# Patient Record
Sex: Female | Born: 1980 | Race: White | Hispanic: No | Marital: Married | Smoking: Never smoker
Health system: Southern US, Community
[De-identification: ages and names within clinical notes are randomized; demographics above are authoritative.]

## PROBLEM LIST (undated history)

## (undated) ENCOUNTER — Inpatient Hospital Stay (HOSPITAL_COMMUNITY): Payer: Self-pay

## (undated) DIAGNOSIS — I10 Essential (primary) hypertension: Secondary | ICD-10-CM

## (undated) DIAGNOSIS — T7840XA Allergy, unspecified, initial encounter: Secondary | ICD-10-CM

## (undated) DIAGNOSIS — J342 Deviated nasal septum: Secondary | ICD-10-CM

## (undated) DIAGNOSIS — F419 Anxiety disorder, unspecified: Secondary | ICD-10-CM

## (undated) DIAGNOSIS — N6489 Other specified disorders of breast: Secondary | ICD-10-CM

## (undated) DIAGNOSIS — K219 Gastro-esophageal reflux disease without esophagitis: Secondary | ICD-10-CM

## (undated) HISTORY — PX: HERNIA REPAIR: SHX51

## (undated) HISTORY — PX: LIPOSUCTION MULTIPLE BODY PARTS: SUR832

## (undated) HISTORY — PX: OTHER SURGICAL HISTORY: SHX169

## (undated) HISTORY — DX: Allergy, unspecified, initial encounter: T78.40XA

---
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
## 2006-08-22 ENCOUNTER — Ambulatory Visit (HOSPITAL_COMMUNITY): Admission: RE | Admit: 2006-08-22 | Discharge: 2006-08-22 | Payer: Self-pay | Admitting: *Deleted

## 2011-05-19 ENCOUNTER — Other Ambulatory Visit: Payer: Self-pay | Admitting: Family Medicine

## 2011-05-19 ENCOUNTER — Ambulatory Visit (INDEPENDENT_AMBULATORY_CARE_PROVIDER_SITE_OTHER): Payer: 59 | Admitting: Family Medicine

## 2011-05-19 ENCOUNTER — Encounter: Payer: Self-pay | Admitting: Family Medicine

## 2011-05-19 DIAGNOSIS — M999 Biomechanical lesion, unspecified: Secondary | ICD-10-CM

## 2011-05-19 DIAGNOSIS — M9981 Other biomechanical lesions of cervical region: Secondary | ICD-10-CM

## 2011-05-19 MED ORDER — ALPRAZOLAM 0.5 MG PO TABS: 0.5000 mg | ORAL_TABLET | Freq: Three times a day (TID) | ORAL | Status: DC | PRN

## 2011-05-19 MED ORDER — ZOLPIDEM TARTRATE 10 MG PO TABS: 10.0000 mg | ORAL_TABLET | Freq: Every evening | ORAL | Status: DC | PRN

## 2011-05-19 MED ORDER — DROSPIRENONE-ETHINYL ESTRADIOL 3-0.02 MG PO TABS: 1.0000 | ORAL_TABLET | Freq: Every day | ORAL | Status: DC

## 2011-05-19 NOTE — Progress Notes (Signed)
  Subjective:    Patient ID: Caitlin Christensen, female    DOB: [birth year], 30 y.o.   MRN: [id number]  HPI  30 yo female here to establish care.  Pt does not have any health problems in the past except for some moles here and there.   Pt  complains of low back pain, worse after working three shifts in a row.  States it usually occurs after a long day, bilateral pain non radiating, improves mildly with aleve.  Pt denies any injury no bowel or bladder problems, no dysuria. Has been manipulated by me in the past with good results.   Some insomnia but takes [place] here and there with good results. Xanax for mild anxiety.   Pt gets her pap smears done at her gynecologists, is on birth control.  Family hx of heart dx but has recently had lab work done. States everything is normal.   Review of Systems     Objective:   Physical Exam BP 110/76  Pulse 101  Temp(Src) 98 F (36.7 C) (Oral)  Ht 5' 6.5" (1.689 m)  Wt 150 lb (68.04 kg)  BMI 23.85 kg/m2  LMP 04/21/2011 General appearance: alert, cooperative and no distress Eyes: conjunctivae/corneas clear. PERRL, EOM's intact. Fundi benign. Neck: no adenopathy, no carotid bruit, supple, symmetrical, trachea midline and thyroid not enlarged, symmetric, no tenderness/mass/nodules Back: mild loss f lordosis of lumbar region Heart: regular rate and rhythm, S1, S2 normal, no murmur, click, rub or gallop and normal apical impulse Abdomen: soft, non-tender; bowel sounds normal; no masses,  no organomegaly Extremities: extremities normal, atraumatic, no cyanosis or edema Pulses: 2+ and symmetric Skin: Skin color, texture, turgor normal. No rashes or lesions mils healing lesion on left forearm the seems to be resolving from mole removal.   OMT Findings: Cervical: C2 rotated and side bent left, c4 rotated and side bent right Thoracic: T5 rotated and side bent right, t7 rotated and side bent left Lumbar: L2 rotated and side bent right, L3 rotated and side  bent right Sacrum: right on right      Assessment & Plan:

## 2011-05-19 NOTE — Assessment & Plan Note (Signed)
After verbal consent pt did have HVLA, with marked improvement.  Gave side effects to look out for and can take anti inflammatories in the acute time frame.  Pt given stretches for tight psoas, told to focus on core strength.  Will see again in 3-6 weeks.

## 2011-05-19 NOTE — Patient Instructions (Signed)
It is so good to see you For your back try to do the stretches I showed you at least 2 times a day and then focus a little on core strength to help the lower back For your husband ask for a new patient form up front.  I would love to see him You should make an appointment with me in 3-6 weeks to see how you are doing with the back.  When you need a refill have your pharmacy call me.  Have a great memorial day weekend!

## 2011-05-19 NOTE — Assessment & Plan Note (Signed)
After verbal consent pt did have HVLA, with marked improvement.  Gave side effects to look out for and can take anti inflammatories in the acute time frame.   

## 2011-06-15 ENCOUNTER — Other Ambulatory Visit: Payer: Self-pay | Admitting: Family Medicine

## 2011-06-15 NOTE — Telephone Encounter (Signed)
Needs refill on Xanax - last refilled from previous doctor [facility]

## 2011-06-16 MED ORDER — ALPRAZOLAM 0.5 MG PO TABS: 0.5000 mg | ORAL_TABLET | Freq: Three times a day (TID) | ORAL | Status: DC | PRN

## 2011-06-16 NOTE — Telephone Encounter (Signed)
Printed and called pt to tell her that it will be up front for her to pick up.

## 2011-09-26 ENCOUNTER — Ambulatory Visit (INDEPENDENT_AMBULATORY_CARE_PROVIDER_SITE_OTHER): Payer: 59 | Admitting: Family Medicine

## 2011-09-26 ENCOUNTER — Encounter: Payer: Self-pay | Admitting: Family Medicine

## 2011-09-26 VITALS — BP 117/81 | HR 123 | Temp 98.5°F | Wt 148.0 lb

## 2011-09-26 DIAGNOSIS — J019 Acute sinusitis, unspecified: Secondary | ICD-10-CM

## 2011-09-26 DIAGNOSIS — J029 Acute pharyngitis, unspecified: Secondary | ICD-10-CM

## 2011-09-26 LAB — POCT RAPID STREP A (OFFICE): Rapid Strep A Screen: NEGATIVE

## 2011-09-26 MED ORDER — AMOXICILLIN 500 MG PO CAPS: 1000.0000 mg | ORAL_CAPSULE | Freq: Two times a day (BID) | ORAL | Status: AC

## 2011-09-26 NOTE — Patient Instructions (Signed)
I am sorry you are not feeling well.  Your rapid strep test was negative.  I think you have a sinus infection, I have sent a prescription for amoxicillin to your pharmacy.  You can also take over the counter tylenol and ibuprofen for your body aches.  Please come back and see [place] if you are not feeling better in a week.

## 2011-09-26 NOTE — Progress Notes (Signed)
Subjective:     Caitlin Christensen is a 30 y.o. female who presents for evaluation of sore throat, sinus pain, and popping in her ears. Associated symptoms include post nasal drip, sinus and nasal congestion and sore throat.  She also complains of generalized body aches.  Onset of symptoms was 1 week ago, and have been gradually worsening since that time. She is drinking plenty of fluids. She has not had a recent close exposure to someone with proven streptococcal pharyngitis or other illness.  She did receive her flu shot one week ago.    Review of Systems Pertinent items are noted in HPI.    Objective:    BP 117/81  Pulse 123  Temp(Src) 98.5 F (36.9 C) (Oral)  Wt 148 lb (67.132 kg)  LMP 09/12/2011 General appearance: alert, cooperative and no distress Eyes: PERRL, EOMIT. Conjunctiva normal.  Ears: abnormal TM right ear - dull and air-fluid level and abnormal TM left ear - dull and air-fluid level Nose: mild congestion, sinus tenderness bilateral Throat: normal findings: lips normal without lesions, buccal mucosa normal, gums healthy, teeth intact, non-carious, tongue midline and normal and soft palate, uvula, and tonsils normal Lungs: clear to auscultation bilaterally Heart: regular rate and rhythm, S1, S2 normal, no murmur, click, rub or gallop Abdomen: soft, non-tender; bowel sounds normal; no masses,  no organomegaly Extremities: extremities normal, atraumatic, no cyanosis or edema Pulses: 2+ and symmetric  Laboratory Strep test done. Results:negative.    Assessment:    Acute pharyngitis, likely  Sinusitis with post nasal drip.    Plan:  Rx. For Amoxicillin to treat Sinusitis.   Use of OTC analgesics recommended as well as salt water gargles. Use of decongestant recommended. Follow up as needed.

## 2011-10-16 ENCOUNTER — Telehealth: Payer: Self-pay | Admitting: Family Medicine

## 2011-10-16 MED ORDER — CETIRIZINE HCL 10 MG PO TABS: 10.0000 mg | ORAL_TABLET | Freq: Every day | ORAL | Status: DC

## 2011-10-16 MED ORDER — FLUTICASONE PROPIONATE 50 MCG/ACT NA SUSP: 2.0000 | Freq: Every day | NASAL | Status: DC

## 2011-10-16 NOTE — Telephone Encounter (Signed)
Called pt told her I would treat like allergies at this time would try flonase and zyrtec if not better in 1 weeks after starting then would consider antibiotics.

## 2011-10-16 NOTE — Telephone Encounter (Signed)
Was seen on 10/2 for a Sinus Infection, has finished the antibiotics that were prescribed, the symptoms are still persisting and she was hoping Dr. Katrinka Blazing would prescribe something without her being seen.  I did attempt to get her to make an appointment.  She would like to talk to Dr. Katrinka Blazing.

## 2012-01-19 ENCOUNTER — Telehealth: Payer: Self-pay | Admitting: Family Medicine

## 2012-01-19 MED ORDER — ZOLPIDEM TARTRATE 10 MG PO TABS: 10.0000 mg | ORAL_TABLET | Freq: Every evening | ORAL | Status: DC | PRN

## 2012-01-19 MED ORDER — ALPRAZOLAM 0.5 MG PO TABS: 0.5000 mg | ORAL_TABLET | Freq: Three times a day (TID) | ORAL | Status: DC | PRN

## 2012-01-19 NOTE — Telephone Encounter (Signed)
Needs refill on her Xanax .5  and Ambien 10mg  - states she had not gotten Ambien from [place] yet.  It was from a previous doctor [facility]

## 2012-01-19 NOTE — Telephone Encounter (Signed)
Will need to pick up prescriptions, they are printed and up front.

## 2012-01-22 NOTE — Telephone Encounter (Signed)
Left message on voicemail informing patient that rx's were up front ready to be picked up.

## 2012-04-16 ENCOUNTER — Ambulatory Visit: Payer: PRIVATE HEALTH INSURANCE | Attending: Family Medicine | Admitting: Physical Therapy

## 2012-04-16 DIAGNOSIS — M545 Low back pain, unspecified: Secondary | ICD-10-CM | POA: Insufficient documentation

## 2012-04-16 DIAGNOSIS — M546 Pain in thoracic spine: Secondary | ICD-10-CM | POA: Insufficient documentation

## 2012-04-16 DIAGNOSIS — IMO0001 Reserved for inherently not codable concepts without codable children: Secondary | ICD-10-CM | POA: Insufficient documentation

## 2012-04-18 ENCOUNTER — Ambulatory Visit: Payer: PRIVATE HEALTH INSURANCE | Attending: Family Medicine | Admitting: Physical Therapy

## 2012-04-18 DIAGNOSIS — IMO0001 Reserved for inherently not codable concepts without codable children: Secondary | ICD-10-CM | POA: Insufficient documentation

## 2012-04-18 DIAGNOSIS — M546 Pain in thoracic spine: Secondary | ICD-10-CM | POA: Insufficient documentation

## 2012-04-18 DIAGNOSIS — M545 Low back pain, unspecified: Secondary | ICD-10-CM | POA: Insufficient documentation

## 2012-04-25 ENCOUNTER — Encounter: Payer: Self-pay | Admitting: Physical Therapy

## 2012-04-25 ENCOUNTER — Ambulatory Visit
Payer: PRIVATE HEALTH INSURANCE | Attending: Family Medicine | Admitting: Rehabilitative and Restorative Service Providers"

## 2012-04-25 DIAGNOSIS — M545 Low back pain, unspecified: Secondary | ICD-10-CM | POA: Insufficient documentation

## 2012-04-25 DIAGNOSIS — M546 Pain in thoracic spine: Secondary | ICD-10-CM | POA: Insufficient documentation

## 2012-04-25 DIAGNOSIS — IMO0001 Reserved for inherently not codable concepts without codable children: Secondary | ICD-10-CM | POA: Insufficient documentation

## 2012-04-30 ENCOUNTER — Ambulatory Visit: Payer: PRIVATE HEALTH INSURANCE | Admitting: Physical Therapy

## 2012-05-02 ENCOUNTER — Ambulatory Visit: Payer: PRIVATE HEALTH INSURANCE | Admitting: Physical Therapy

## 2012-05-07 ENCOUNTER — Ambulatory Visit: Payer: PRIVATE HEALTH INSURANCE | Admitting: Rehabilitative and Restorative Service Providers"

## 2012-05-09 ENCOUNTER — Ambulatory Visit: Payer: PRIVATE HEALTH INSURANCE | Admitting: Physical Therapy

## 2012-06-19 ENCOUNTER — Encounter: Payer: Self-pay | Admitting: Family Medicine

## 2012-06-19 ENCOUNTER — Ambulatory Visit (INDEPENDENT_AMBULATORY_CARE_PROVIDER_SITE_OTHER): Payer: 59 | Admitting: Family Medicine

## 2012-06-19 VITALS — BP 121/85 | HR 102 | Temp 98.5°F | Ht 66.5 in | Wt 155.0 lb

## 2012-06-19 DIAGNOSIS — F419 Anxiety disorder, unspecified: Secondary | ICD-10-CM

## 2012-06-19 DIAGNOSIS — F411 Generalized anxiety disorder: Secondary | ICD-10-CM

## 2012-06-19 DIAGNOSIS — M549 Dorsalgia, unspecified: Secondary | ICD-10-CM

## 2012-06-19 DIAGNOSIS — M999 Biomechanical lesion, unspecified: Secondary | ICD-10-CM

## 2012-06-19 MED ORDER — HYDROXYZINE HCL 25 MG PO TABS: 25.0000 mg | ORAL_TABLET | Freq: Three times a day (TID) | ORAL | Status: DC | PRN

## 2012-06-19 MED ORDER — ALPRAZOLAM 0.5 MG PO TABS: 0.5000 mg | ORAL_TABLET | Freq: Three times a day (TID) | ORAL | Status: DC | PRN

## 2012-06-19 NOTE — Patient Instructions (Signed)
It is wonderful to see you. Have giving you a prescription for Xanax. Try to only uses one time a day as needed hopefully you'll be using it much less than that. Also giving you a prescription for hydroxyzine that you can use up to 3 times a day. First time he take it please be at home because it might make you sleepy. Continue doing her exercises and stretches I think they're making the biggest difference. If you want you to make an appointment with me at sports medicine in 4-6 weeks for manipulation.

## 2012-06-19 NOTE — Progress Notes (Signed)
Patient ID: Caitlin Christensen, female   DOB: 26-Feb-1981, 31 y.o.   MRN: [id number]  31 year old female with no significant past medical history coming in for  #1 back pain-patient did have a work related injury which she has seen occupational health for over the course of the last month. Patient has been doing physical therapy and has been improving somewhat but still has some lower back pain. Patient states it's mostly right-sided non-radiating, no bowel or bladder incontinence, no weakness in the legs. Patient states that it does seem to be though constant with intermittent flares. Patient has been taking over-the-counter medications with minimal improvement. Patient has had manipulation done on her before and is wondering if that could be beneficial.  #2 anxiety-patient has had a lot of anxiety throughout her whole life she states it has seemed to be worsening since her injury. Patient feels like she can become overwhelmed at work as well as at home. Patient states that her husband is her saving grace but she does have trouble with her parents who live very close. Patient states at work with many of the different changes that are happening at this time morale is low and there is a lot more attitudes that seemed to be conflicting which has not made a nice working environment. Patient states she has the same she would call panic attacks where she feels her heart rate due to racing she starts sweating and she has to get away from what ever environment that this is occurring in. Patient has had Xanax in the past which does help but she only uses it one or 2 pills a month it seems to. Patient denies any depressive thoughts denies any suicidal or homicidal ideation, and denies any insomnia.  Review of systems: Denies fever, chills, nausea vomiting abdominal pain, dysuria, chest pain, shortness of breath dyspnea on exertion or numbness in extremities  Physical exam In general: No apparent distress healthy  female Cardiovascular: Regular rate and rhythm no murmur Pulmonary: Clear to auscultation bilaterally Abdomen: Bowel sounds positive nontender nondistended Extremities: Neurovascularly intact with 5 out of 5 strength.  OMT Physical Exam  Cervical  C 2 F RS left C4 F RS right  Thoracic T4 E RS left  Lumbar L3 F RS right  Sacrum Right on right  [place] right anterior

## 2012-06-20 DIAGNOSIS — F419 Anxiety disorder, unspecified: Secondary | ICD-10-CM | POA: Insufficient documentation

## 2012-06-20 DIAGNOSIS — M545 Low back pain, unspecified: Secondary | ICD-10-CM | POA: Insufficient documentation

## 2012-06-20 NOTE — Assessment & Plan Note (Signed)
Patient does have mostly situational anxiety with some potential panic attacks. At this time we'll give Xanax as needed the patient is more concerned to 2 having these panic attacks and environmental situations such as work. I think at this time potentially some hydroxyzine may be more pertinent to patient's problem. We will do 25 mg up to 3 times a day as needed. Patient told to try this medication at home initially to see if this makes her tired or fatigued at all. Patient and will still has headaches on hand when she definitely needs it and she does not need to work. I do not feel that there is any underlying depression or generalized anxiety disorder call for a more constant interval of medication such as an antidepressant. Patient will followup again in 4-6 weeks.

## 2012-06-20 NOTE — Assessment & Plan Note (Signed)
Encourage patient to do more core strengthening exercises, discussed weight loss, discussed proper stretching techniques as well as lifting techniques.

## 2012-06-20 NOTE — Assessment & Plan Note (Signed)
Patient after verbal consent patient did have HVLA and muscle energy techniques with marked improvement. Please see back pain assessment and plan for further instructions given to patient. Patient will followup in 4-6 weeks.

## 2012-08-27 ENCOUNTER — Ambulatory Visit (INDEPENDENT_AMBULATORY_CARE_PROVIDER_SITE_OTHER): Payer: 59 | Admitting: Family Medicine

## 2012-08-27 ENCOUNTER — Encounter: Payer: Self-pay | Admitting: Family Medicine

## 2012-08-27 VITALS — BP 112/74 | HR 79 | Ht 66.0 in | Wt 152.0 lb

## 2012-08-27 DIAGNOSIS — F411 Generalized anxiety disorder: Secondary | ICD-10-CM

## 2012-08-27 DIAGNOSIS — F419 Anxiety disorder, unspecified: Secondary | ICD-10-CM

## 2012-08-27 DIAGNOSIS — M999 Biomechanical lesion, unspecified: Secondary | ICD-10-CM

## 2012-08-27 MED ORDER — MELOXICAM 15 MG PO TABS: 15.0000 mg | ORAL_TABLET | Freq: Every day | ORAL | Status: DC

## 2012-08-27 MED ORDER — HYDROXYZINE HCL 25 MG PO TABS: 25.0000 mg | ORAL_TABLET | Freq: Three times a day (TID) | ORAL | Status: DC | PRN

## 2012-08-27 MED ORDER — TRAMADOL HCL 50 MG PO TABS: 50.0000 mg | ORAL_TABLET | Freq: Every evening | ORAL | Status: AC | PRN

## 2012-08-27 NOTE — Progress Notes (Signed)
Patient ID: Caitlin Christensen, female   DOB: 1981-09-27, 31 y.o.   MRN: [id number]  31 year old female with no significant past medical history coming in for  #1 back pain- patient has finished physical therapy and occupational health at this time. Patient states that her back pain has improved significantly. Patient still has some mild low back pain, nonradiating mostly related to work after a long day. Patient denies any radiation of the pain, any weakness in the legs or any bowel or bladder incontinence. Patient states that overall she seems to be improving with the exercises that she has been doing. Patient is also started going to the gym on a regular basis which has been beneficial.  #2 anxiety-patient states that she is doing much better. Patient has been meditating which is help. In addition this patient has also been taking the hydroxyzine and I prescribed as needed. Patient states she uses 2 or 3 of them in a week. Patient denies any side effects to the medication and states that it has helped out significantly. Patient would like a refill today.  Review of systems: Denies fever, chills, nausea vomiting abdominal pain, dysuria, chest pain, shortness of breath dyspnea on exertion or numbness in extremities  Physical exam In general: No apparent distress healthy female Cardiovascular: Regular rate and rhythm no murmur Pulmonary: Clear to auscultation bilaterally Abdomen: Bowel sounds positive nontender nondistended Extremities: Neurovascularly intact with 5 out of 5 strength.  OMT Physical Exam  Cervical  C4 F RS right  Thoracic T4 E RS left T6 E RS right  Lumbar L3 F RS right L5 F RS right  Sacrum Right on right  [place] right anterior

## 2012-08-27 NOTE — Assessment & Plan Note (Signed)
Decision today to treat with OMT was based on Physical Exam  After verbal consent patient was treated with HVLA techniques in lumbar and sacral areas  Patient tolerated the procedure well with improvement in symptoms  Patient given exercises, stretches and lifestyle modifications  See medications in patient instructions if given  Patient will follow up in 6 weeks

## 2012-08-27 NOTE — Assessment & Plan Note (Signed)
The patient is doing very well on a regimen. Patient has no signs of depression. Patient is not having any insomnia or any trouble at work with this medication. We'll refill this medication and followup as needed. Patient knows if she has any findings of depression she will give me a call and we will discuss this further.

## 2012-08-27 NOTE — Patient Instructions (Addendum)
I sent in your meds including tramadol and meloxicam to try  I will see you when I see you. Tell everyone hi.

## 2013-01-01 ENCOUNTER — Ambulatory Visit (INDEPENDENT_AMBULATORY_CARE_PROVIDER_SITE_OTHER): Payer: 59 | Admitting: Internal Medicine

## 2013-01-01 ENCOUNTER — Other Ambulatory Visit (INDEPENDENT_AMBULATORY_CARE_PROVIDER_SITE_OTHER): Payer: 59

## 2013-01-01 ENCOUNTER — Encounter: Payer: Self-pay | Admitting: Internal Medicine

## 2013-01-01 VITALS — BP 112/68 | HR 98 | Temp 97.6°F | Resp 16 | Ht 66.5 in | Wt 150.0 lb

## 2013-01-01 DIAGNOSIS — Z0000 Encounter for general adult medical examination without abnormal findings: Secondary | ICD-10-CM

## 2013-01-01 LAB — LIPID PANEL
Cholesterol: 164 mg/dL (ref 0–200)
LDL Cholesterol: 114 mg/dL — ABNORMAL HIGH (ref 0–99)
Total CHOL/HDL Ratio: 5

## 2013-01-01 LAB — COMPREHENSIVE METABOLIC PANEL
ALT: 21 U/L (ref 0–35)
AST: 17 U/L (ref 0–37)
Albumin: 3.8 g/dL (ref 3.5–5.2)
BUN: 8 mg/dL (ref 6–23)
CO2: 25 mEq/L (ref 19–32)
Calcium: 8.9 mg/dL (ref 8.4–10.5)
Chloride: 110 mEq/L (ref 96–112)
GFR: 103.15 mL/min (ref >60.00)
Potassium: 4.2 mEq/L (ref 3.5–5.1)

## 2013-01-01 LAB — CBC WITH DIFFERENTIAL/PLATELET
Basophils Absolute: 0 10*3/uL (ref 0.0–0.1)
Eosinophils Absolute: 0.1 10*3/uL (ref 0.0–0.7)
Lymphocytes Relative: 43.4 % (ref 12.0–46.0)
MCHC: 33.9 g/dL (ref 30.0–36.0)
Neutrophils Relative %: 44.7 % (ref 43.0–77.0)
Platelets: 493 10*3/uL — ABNORMAL HIGH (ref 150.0–400.0)
RDW: 12.7 % (ref 11.5–14.6)

## 2013-01-01 NOTE — Assessment & Plan Note (Signed)
Exam done Vaccines were reviewed Labs ordered Pt ed material was given 

## 2013-01-01 NOTE — Patient Instructions (Signed)
Preventive Care for Adults, Female A healthy lifestyle and preventive care can promote health and wellness. Preventive health guidelines for women include the following key practices.  A routine yearly physical is a good way to check with your caregiver about your health and preventive screening. It is a chance to share any concerns and updates on your health, and to receive a thorough exam.  Visit your dentist for a routine exam and preventive care every 6 months. Brush your teeth twice a day and floss once a day. Good oral hygiene prevents tooth decay and gum disease.  The frequency of eye exams is based on your age, health, family medical history, use of contact lenses, and other factors. Follow your caregiver's recommendations for frequency of eye exams.  Eat a healthy diet. Foods like vegetables, fruits, whole grains, low-fat dairy products, and lean protein foods contain the nutrients you need without too many calories. Decrease your intake of foods high in solid fats, added sugars, and salt. Eat the right amount of calories for you.Get information about a proper diet from your caregiver, if necessary.  Regular physical exercise is one of the most important things you can do for your health. Most adults should get at least 150 minutes of moderate-intensity exercise (any activity that increases your heart rate and causes you to sweat) each week. In addition, most adults need muscle-strengthening exercises on 2 or more days a week.  Maintain a healthy weight. The body mass index (BMI) is a screening tool to identify possible weight problems. It provides an estimate of body fat based on height and weight. Your caregiver can help determine your BMI, and can help you achieve or maintain a healthy weight.For adults 20 years and older:  A BMI below 18.5 is considered underweight.  A BMI of 18.5 to 24.9 is normal.  A BMI of 25 to 29.9 is considered overweight.  A BMI of 30 and above is  considered obese.  Maintain normal blood lipids and cholesterol levels by exercising and minimizing your intake of saturated fat. Eat a balanced diet with plenty of fruit and vegetables. Blood tests for lipids and cholesterol should begin at age 20 and be repeated every 5 years. If your lipid or cholesterol levels are high, you are over 50, or you are at high risk for heart disease, you may need your cholesterol levels checked more frequently.Ongoing high lipid and cholesterol levels should be treated with medicines if diet and exercise are not effective.  If you smoke, find out from your caregiver how to quit. If you do not use tobacco, do not start.  If you are pregnant, do not drink alcohol. If you are breastfeeding, be very cautious about drinking alcohol. If you are not pregnant and choose to drink alcohol, do not exceed 1 drink per day. One drink is considered to be 12 ounces (355 mL) of beer, 5 ounces (148 mL) of wine, or 1.5 ounces (44 mL) of liquor.  Avoid use of street drugs. Do not share needles with anyone. Ask for help if you need support or instructions about stopping the use of drugs.  High blood pressure causes heart disease and increases the risk of stroke. Your blood pressure should be checked at least every 1 to 2 years. Ongoing high blood pressure should be treated with medicines if weight loss and exercise are not effective.  If you are 55 to 32 years old, ask your caregiver if you should take aspirin to prevent strokes.  Diabetes   screening involves taking a blood sample to check your fasting blood sugar level. This should be done once every 3 years, after age 45, if you are within normal weight and without risk factors for diabetes. Testing should be considered at a younger age or be carried out more frequently if you are overweight and have at least 1 risk factor for diabetes.  Breast cancer screening is essential preventive care for women. You should practice "breast  self-awareness." This means understanding the normal appearance and feel of your breasts and may include breast self-examination. Any changes detected, no matter how small, should be reported to a caregiver. Women in their 20s and 30s should have a clinical breast exam (CBE) by a caregiver as part of a regular health exam every 1 to 3 years. After age 40, women should have a CBE every year. Starting at age 40, women should consider having a mammography (breast X-ray test) every year. Women who have a family history of breast cancer should talk to their caregiver about genetic screening. Women at a high risk of breast cancer should talk to their caregivers about having magnetic resonance imaging (MRI) and a mammography every year.  The Pap test is a screening test for cervical cancer. A Pap test can show cell changes on the cervix that might become cervical cancer if left untreated. A Pap test is a procedure in which cells are obtained and examined from the lower end of the uterus (cervix).  Women should have a Pap test starting at age 21.  Between ages 21 and 29, Pap tests should be repeated every 2 years.  Beginning at age 30, you should have a Pap test every 3 years as long as the past 3 Pap tests have been normal.  Some women have medical problems that increase the chance of getting cervical cancer. Talk to your caregiver about these problems. It is especially important to talk to your caregiver if a new problem develops soon after your last Pap test. In these cases, your caregiver may recommend more frequent screening and Pap tests.  The above recommendations are the same for women who have or have not gotten the vaccine for human papillomavirus (HPV).  If you had a hysterectomy for a problem that was not cancer or a condition that could lead to cancer, then you no longer need Pap tests. Even if you no longer need a Pap test, a regular exam is a good idea to make sure no other problems are  starting.  If you are between ages 65 and 70, and you have had normal Pap tests going back 10 years, you no longer need Pap tests. Even if you no longer need a Pap test, a regular exam is a good idea to make sure no other problems are starting.  If you have had past treatment for cervical cancer or a condition that could lead to cancer, you need Pap tests and screening for cancer for at least 20 years after your treatment.  If Pap tests have been discontinued, risk factors (such as a new sexual partner) need to be reassessed to determine if screening should be resumed.  The HPV test is an additional test that may be used for cervical cancer screening. The HPV test looks for the virus that can cause the cell changes on the cervix. The cells collected during the Pap test can be tested for HPV. The HPV test could be used to screen women aged 30 years and older, and should   be used in women of any age who have unclear Pap test results. After the age of 30, women should have HPV testing at the same frequency as a Pap test.  Colorectal cancer can be detected and often prevented. Most routine colorectal cancer screening begins at the age of 50 and continues through age 75. However, your caregiver may recommend screening at an earlier age if you have risk factors for colon cancer. On a yearly basis, your caregiver may provide home test kits to check for hidden blood in the stool. Use of a small camera at the end of a tube, to directly examine the colon (sigmoidoscopy or colonoscopy), can detect the earliest forms of colorectal cancer. Talk to your caregiver about this at age 50, when routine screening begins. Direct examination of the colon should be repeated every 5 to 10 years through age 75, unless early forms of pre-cancerous polyps or small growths are found.  Hepatitis C blood testing is recommended for all people born from 1945 through 1965 and any individual with known risks for hepatitis C.  Practice  safe sex. Use condoms and avoid high-risk sexual practices to reduce the spread of sexually transmitted infections (STIs). STIs include gonorrhea, chlamydia, syphilis, trichomonas, herpes, HPV, and human immunodeficiency virus (HIV). Herpes, HIV, and HPV are viral illnesses that have no cure. They can result in disability, cancer, and death. Sexually active women aged 25 and younger should be checked for chlamydia. Older women with new or multiple partners should also be tested for chlamydia. Testing for other STIs is recommended if you are sexually active and at increased risk.  Osteoporosis is a disease in which the bones lose minerals and strength with aging. This can result in serious bone fractures. The risk of osteoporosis can be identified using a bone density scan. Women ages 65 and over and women at risk for fractures or osteoporosis should discuss screening with their caregivers. Ask your caregiver whether you should take a calcium supplement or vitamin D to reduce the rate of osteoporosis.  Menopause can be associated with physical symptoms and risks. Hormone replacement therapy is available to decrease symptoms and risks. You should talk to your caregiver about whether hormone replacement therapy is right for you.  Use sunscreen with sun protection factor (SPF) of 30 or more. Apply sunscreen liberally and repeatedly throughout the day. You should seek shade when your shadow is shorter than you. Protect yourself by wearing long sleeves, pants, a wide-brimmed hat, and sunglasses year round, whenever you are outdoors.  Once a month, do a whole body skin exam, using a mirror to look at the skin on your back. Notify your caregiver of new moles, moles that have irregular borders, moles that are larger than a pencil eraser, or moles that have changed in shape or color.  Stay current with required immunizations.  Influenza. You need a dose every fall (or winter). The composition of the flu vaccine  changes each year, so being vaccinated once is not enough.  Pneumococcal polysaccharide. You need 1 to 2 doses if you smoke cigarettes or if you have certain chronic medical conditions. You need 1 dose at age 65 (or older) if you have never been vaccinated.  Tetanus, diphtheria, pertussis (Tdap, Td). Get 1 dose of Tdap vaccine if you are younger than age 65, are over 65 and have contact with an infant, are a healthcare worker, are pregnant, or simply want to be protected from whooping cough. After that, you need a Td   booster dose every 10 years. Consult your caregiver if you have not had at least 3 tetanus and diphtheria-containing shots sometime in your life or have a deep or dirty wound.  HPV. You need this vaccine if you are a woman age 26 or younger. The vaccine is given in 3 doses over 6 months.  Measles, mumps, rubella (MMR). You need at least 1 dose of MMR if you were born in 1957 or later. You may also need a second dose.  Meningococcal. If you are age 19 to 21 and a first-year college student living in a residence hall, or have one of several medical conditions, you need to get vaccinated against meningococcal disease. You may also need additional booster doses.  Zoster (shingles). If you are age 60 or older, you should get this vaccine.  Varicella (chickenpox). If you have never had chickenpox or you were vaccinated but received only 1 dose, talk to your caregiver to find out if you need this vaccine.  Hepatitis A. You need this vaccine if you have a specific risk factor for hepatitis A virus infection or you simply wish to be protected from this disease. The vaccine is usually given as 2 doses, 6 to 18 months apart.  Hepatitis B. You need this vaccine if you have a specific risk factor for hepatitis B virus infection or you simply wish to be protected from this disease. The vaccine is given in 3 doses, usually over 6 months. Preventive Services / Frequency Ages 19 to 39  Blood  pressure check.** / Every 1 to 2 years.  Lipid and cholesterol check.** / Every 5 years beginning at age 20.  Clinical breast exam.** / Every 3 years for women in their 20s and 30s.  Pap test.** / Every 2 years from ages 21 through 29. Every 3 years starting at age 30 through age 65 or 70 with a history of 3 consecutive normal Pap tests.  HPV screening.** / Every 3 years from ages 30 through ages 65 to 70 with a history of 3 consecutive normal Pap tests.  Hepatitis C blood test.** / For any individual with known risks for hepatitis C.  Skin self-exam. / Monthly.  Influenza immunization.** / Every year.  Pneumococcal polysaccharide immunization.** / 1 to 2 doses if you smoke cigarettes or if you have certain chronic medical conditions.  Tetanus, diphtheria, pertussis (Tdap, Td) immunization. / A one-time dose of Tdap vaccine. After that, you need a Td booster dose every 10 years.  HPV immunization. / 3 doses over 6 months, if you are 26 and younger.  Measles, mumps, rubella (MMR) immunization. / You need at least 1 dose of MMR if you were born in 1957 or later. You may also need a second dose.  Meningococcal immunization. / 1 dose if you are age 19 to 21 and a first-year college student living in a residence hall, or have one of several medical conditions, you need to get vaccinated against meningococcal disease. You may also need additional booster doses.  Varicella immunization.** / Consult your caregiver.  Hepatitis A immunization.** / Consult your caregiver. 2 doses, 6 to 18 months apart.  Hepatitis B immunization.** / Consult your caregiver. 3 doses usually over 6 months. Ages 40 to 64  Blood pressure check.** / Every 1 to 2 years.  Lipid and cholesterol check.** / Every 5 years beginning at age 20.  Clinical breast exam.** / Every year after age 40.  Mammogram.** / Every year beginning at age 40   and continuing for as long as you are in good health. Consult with your  caregiver.  Pap test.** / Every 3 years starting at age 30 through age 65 or 70 with a history of 3 consecutive normal Pap tests.  HPV screening.** / Every 3 years from ages 30 through ages 65 to 70 with a history of 3 consecutive normal Pap tests.  Fecal occult blood test (FOBT) of stool. / Every year beginning at age 50 and continuing until age 75. You may not need to do this test if you get a colonoscopy every 10 years.  Flexible sigmoidoscopy or colonoscopy.** / Every 5 years for a flexible sigmoidoscopy or every 10 years for a colonoscopy beginning at age 50 and continuing until age 75.  Hepatitis C blood test.** / For all people born from 1945 through 1965 and any individual with known risks for hepatitis C.  Skin self-exam. / Monthly.  Influenza immunization.** / Every year.  Pneumococcal polysaccharide immunization.** / 1 to 2 doses if you smoke cigarettes or if you have certain chronic medical conditions.  Tetanus, diphtheria, pertussis (Tdap, Td) immunization.** / A one-time dose of Tdap vaccine. After that, you need a Td booster dose every 10 years.  Measles, mumps, rubella (MMR) immunization. / You need at least 1 dose of MMR if you were born in 1957 or later. You may also need a second dose.  Varicella immunization.** / Consult your caregiver.  Meningococcal immunization.** / Consult your caregiver.  Hepatitis A immunization.** / Consult your caregiver. 2 doses, 6 to 18 months apart.  Hepatitis B immunization.** / Consult your caregiver. 3 doses, usually over 6 months. Ages 65 and over  Blood pressure check.** / Every 1 to 2 years.  Lipid and cholesterol check.** / Every 5 years beginning at age 20.  Clinical breast exam.** / Every year after age 40.  Mammogram.** / Every year beginning at age 40 and continuing for as long as you are in good health. Consult with your caregiver.  Pap test.** / Every 3 years starting at age 30 through age 65 or 70 with a 3  consecutive normal Pap tests. Testing can be stopped between 65 and 70 with 3 consecutive normal Pap tests and no abnormal Pap or HPV tests in the past 10 years.  HPV screening.** / Every 3 years from ages 30 through ages 65 or 70 with a history of 3 consecutive normal Pap tests. Testing can be stopped between 65 and 70 with 3 consecutive normal Pap tests and no abnormal Pap or HPV tests in the past 10 years.  Fecal occult blood test (FOBT) of stool. / Every year beginning at age 50 and continuing until age 75. You may not need to do this test if you get a colonoscopy every 10 years.  Flexible sigmoidoscopy or colonoscopy.** / Every 5 years for a flexible sigmoidoscopy or every 10 years for a colonoscopy beginning at age 50 and continuing until age 75.  Hepatitis C blood test.** / For all people born from 1945 through 1965 and any individual with known risks for hepatitis C.  Osteoporosis screening.** / A one-time screening for women ages 65 and over and women at risk for fractures or osteoporosis.  Skin self-exam. / Monthly.  Influenza immunization.** / Every year.  Pneumococcal polysaccharide immunization.** / 1 dose at age 65 (or older) if you have never been vaccinated.  Tetanus, diphtheria, pertussis (Tdap, Td) immunization. / A one-time dose of Tdap vaccine if you are over   65 and have contact with an infant, are a healthcare worker, or simply want to be protected from whooping cough. After that, you need a Td booster dose every 10 years.  Varicella immunization.** / Consult your caregiver.  Meningococcal immunization.** / Consult your caregiver.  Hepatitis A immunization.** / Consult your caregiver. 2 doses, 6 to 18 months apart.  Hepatitis B immunization.** / Check with your caregiver. 3 doses, usually over 6 months. ** Family history and personal history of risk and conditions may change your caregiver's recommendations. Document Released: 02/06/2002 Document Revised: 03/04/2012  Document Reviewed: 05/08/2011 [employer] Patient Information 2013 [employer].  

## 2013-01-01 NOTE — Progress Notes (Signed)
  Subjective:    Patient ID: Caitlin Christensen, female    DOB: 13-Sep-1981, 32 y.o.   MRN: [id number]  HPI  New to me she comes in for a physical - she has recovered from a recent URI and GI illness but tells me that she feels well today.  Review of Systems  Constitutional: Negative.   HENT: Negative.   Eyes: Negative.   Respiratory: Negative.   Cardiovascular: Negative.   Gastrointestinal: Negative.   Genitourinary: Negative.   Musculoskeletal: Negative.   Skin: Negative.   Neurological: Negative.   Hematological: Negative.   Psychiatric/Behavioral: Negative.        Objective:   Physical Exam  Vitals reviewed. Constitutional: She is oriented to person, place, and time. She appears well-developed and well-nourished. No distress.  HENT:  Head: Normocephalic and atraumatic.  Mouth/Throat: Oropharynx is clear and moist. No oropharyngeal exudate.  Eyes: Conjunctivae normal are normal. Right eye exhibits no discharge. Left eye exhibits no discharge. No scleral icterus.  Neck: Normal range of motion. Neck supple. No JVD present. No tracheal deviation present. No thyromegaly present.  Cardiovascular: Normal rate, regular rhythm, normal heart sounds and intact distal pulses.  Exam reveals no gallop and no friction rub.   No murmur heard. Pulmonary/Chest: Effort normal and breath sounds normal. No stridor. No respiratory distress. She has no wheezes. She has no rales. She exhibits no tenderness.  Abdominal: Soft. Bowel sounds are normal. She exhibits no distension and no mass. There is no tenderness. There is no rebound and no guarding.  Musculoskeletal: Normal range of motion. She exhibits no edema and no tenderness.  Lymphadenopathy:    She has no cervical adenopathy.  Neurological: She is oriented to person, place, and time.  Skin: Skin is warm and dry. No rash noted. She is not diaphoretic. No erythema. No pallor.  Psychiatric: She has a normal mood and affect. Her behavior is normal.  Judgment and thought content normal.     No results found for this basename: WBC, HGB, HCT, PLT, GLUCOSE, CHOL, TRIG, HDL, LDLDIRECT, LDLCALC, ALT, AST, NA, K, CL, CREATININE, BUN, CO2, TSH, PSA, INR, GLUF, HGBA1C, MICROALBUR       Assessment & Plan:

## 2013-10-30 ENCOUNTER — Other Ambulatory Visit: Payer: Self-pay

## 2014-11-17 LAB — OB RESULTS CONSOLE RPR: RPR: NONREACTIVE

## 2014-11-17 LAB — OB RESULTS CONSOLE ABO/RH: RH Type: POSITIVE

## 2014-11-17 LAB — OB RESULTS CONSOLE HEPATITIS B SURFACE ANTIGEN: Hepatitis B Surface Ag: NEGATIVE

## 2014-11-17 LAB — OB RESULTS CONSOLE HIV ANTIBODY (ROUTINE TESTING): HIV: NONREACTIVE

## 2014-11-17 LAB — OB RESULTS CONSOLE ANTIBODY SCREEN: Antibody Screen: NEGATIVE

## 2014-11-17 LAB — OB RESULTS CONSOLE RUBELLA ANTIBODY, IGM: Rubella: NON-IMMUNE/NOT IMMUNE

## 2014-12-25 ENCOUNTER — Inpatient Hospital Stay (HOSPITAL_COMMUNITY): Admission: AD | Admit: 2014-12-25 | Payer: 59 | Source: Ambulatory Visit | Admitting: Obstetrics and Gynecology

## 2014-12-25 DIAGNOSIS — K219 Gastro-esophageal reflux disease without esophagitis: Secondary | ICD-10-CM

## 2014-12-25 HISTORY — DX: Gastro-esophageal reflux disease without esophagitis: K21.9

## 2015-06-07 ENCOUNTER — Other Ambulatory Visit: Payer: Self-pay | Admitting: Obstetrics and Gynecology

## 2015-06-07 DIAGNOSIS — N6310 Unspecified lump in the right breast, unspecified quadrant: Secondary | ICD-10-CM

## 2015-06-07 DIAGNOSIS — Z3490 Encounter for supervision of normal pregnancy, unspecified, unspecified trimester: Secondary | ICD-10-CM

## 2015-06-08 NOTE — H&P (Signed)
Caitlin Christensen is a 34 y.o. female presenting for cesarean section secondary to breech. Maternal Medical History:  Fetal activity: Perceived fetal activity is normal.      OB History    Gravida Para Term Preterm AB TAB SAB Ectopic Multiple Living   0 0 0 0 0 0 0 0 0 0      No past medical history on file. Past Surgical History  Procedure Laterality Date  . Wisdom tooth extraction  1999   Family History: family history includes Arthritis in her father; Hearing loss in her father; Stroke in her maternal grandmother. There is no history of Cancer, Diabetes, Drug abuse, Early death, Heart disease, Hyperlipidemia, Hypertension, or Kidney disease. Social History:  reports that she has never smoked. She has never used smokeless tobacco. She reports that she does not drink alcohol or use illicit drugs.   Prenatal Transfer Tool  Maternal Diabetes: No Genetic Screening: Normal Maternal Ultrasounds/Referrals: Normal Fetal Ultrasounds or other Referrals:  None Maternal Substance Abuse:  No Significant Maternal Medications:  None Significant Maternal Lab Results:  None Other Comments:  None  Review of Systems  Eyes: Negative for blurred vision.  Gastrointestinal: Negative for abdominal pain.  Neurological: Negative for headaches.      There were no vitals taken for this visit. Maternal Exam:  Abdomen: Patient reports no abdominal tenderness. Fetal presentation: breech     Physical Exam  Cardiovascular: Normal rate.   Respiratory: Effort normal.    Prenatal labs: ABO, Rh:   Antibody:   Rubella:   RPR:    HBsAg:    HIV:    GBS:     Assessment/Plan: 34 yo G1P0 with breech for cesarean section. Risks reviewed.   Lulla Linville II,Jordan Pardini E 06/08/2015, 6:37 PM

## 2015-06-10 ENCOUNTER — Inpatient Hospital Stay (HOSPITAL_COMMUNITY)
Admission: AD | Admit: 2015-06-10 | Discharge: 2015-06-10 | Disposition: A | Discharge status: Home / Self Care | Payer: 59 | Source: Ambulatory Visit | Attending: Obstetrics and Gynecology | Admitting: Obstetrics and Gynecology

## 2015-06-10 ENCOUNTER — Encounter (HOSPITAL_COMMUNITY): Payer: Self-pay | Admitting: *Deleted

## 2015-06-10 ENCOUNTER — Ambulatory Visit
Admission: RE | Admit: 2015-06-10 | Discharge: 2015-06-10 | Disposition: A | Discharge status: Home / Self Care | Payer: 59 | Source: Ambulatory Visit | Attending: Obstetrics and Gynecology | Admitting: Obstetrics and Gynecology

## 2015-06-10 DIAGNOSIS — N6310 Unspecified lump in the right breast, unspecified quadrant: Secondary | ICD-10-CM

## 2015-06-10 DIAGNOSIS — O9989 Other specified diseases and conditions complicating pregnancy, childbirth and the puerperium: Secondary | ICD-10-CM | POA: Insufficient documentation

## 2015-06-10 DIAGNOSIS — Z3A37 37 weeks gestation of pregnancy: Secondary | ICD-10-CM | POA: Insufficient documentation

## 2015-06-10 DIAGNOSIS — Z3490 Encounter for supervision of normal pregnancy, unspecified, unspecified trimester: Secondary | ICD-10-CM

## 2015-06-10 DIAGNOSIS — W19XXXA Unspecified fall, initial encounter: Secondary | ICD-10-CM | POA: Diagnosis not present

## 2015-06-10 DIAGNOSIS — O26893 Other specified pregnancy related conditions, third trimester: Secondary | ICD-10-CM

## 2015-06-10 DIAGNOSIS — O321XX0 Maternal care for breech presentation, not applicable or unspecified: Secondary | ICD-10-CM | POA: Insufficient documentation

## 2015-06-10 NOTE — MAU Note (Signed)
Pt reports she was standing on the couch and when she came down she lost her balance and fell on her righ side. Has felt baby move. Reports some soreness on right side.

## 2015-06-10 NOTE — MAU Provider Note (Signed)
History     CSN: [id number]  Arrival date and time: 06/10/15 1701   First Provider Initiated Contact with Patient 06/10/15 1725      Chief Complaint  Patient presents with  . Fall   HPI Caitlin Christensen is a 34 y.o. G1P0000 at [redacted]w[redacted]d who presents to MAU today with complaint of fall and abdominal trauma. The patient states that around 1600 today she fell at home and hit the right side of her abdomen on the ottoman in her living room. She denies abdominal pain, vaginal bleeding, contractions or LOF. She reports good fetal movement while at home, but less since arrival in MAU. She denies complications with the pregnancy. C/S is scheduled for 06/25/15 due to breech presentation.   OB History    Gravida Para Term Preterm AB TAB SAB Ectopic Multiple Living        Past Medical History  Diagnosis Date  . Medical history non-contributory     Past Surgical History  Procedure Laterality Date  . Wisdom tooth extraction  1999  . Hernia repair    . Myringotomy    . Liposuction multiple body parts      Stomach and hips    Family History  Problem Relation Age of Onset  . Arthritis Father   . Hearing loss Father   . Stroke Maternal Grandmother   . Cancer Neg Hx   . Diabetes Neg Hx   . Drug abuse Neg Hx   . Early death Neg Hx   . Heart disease Neg Hx   . Hyperlipidemia Neg Hx   . Hypertension Neg Hx   . Kidney disease Neg Hx     History  Substance Use Topics  . Smoking status: Never Smoker   . Smokeless tobacco: Never Used  . Alcohol Use: No    Allergies: No Known Allergies  Prescriptions prior to admission  Medication Sig Dispense Refill Last Dose  . acetaminophen (TYLENOL) 500 MG tablet Take 500 mg by mouth every 6 (six) hours as needed for mild pain, moderate pain or headache.   Past Week at Unknown time  . pantoprazole (PROTONIX) 40 MG tablet Take 40 mg by mouth daily.   06/10/2015 at Unknown time  . Prenatal Vit-Fe Fumarate-FA (PRENATAL  MULTIVITAMIN) TABS tablet Take 1 tablet by mouth daily at 12 noon.   06/10/2015 at Unknown time  . zolpidem (AMBIEN) 10 MG tablet Take 10 mg by mouth at bedtime as needed for sleep.    Past Month at Unknown time  . [DISCONTINUED] ALPRAZolam (XANAX) 0.5 MG tablet Take 0.5 mg by mouth daily.   Taking  . [DISCONTINUED] fluticasone (FLONASE) 50 MCG/ACT nasal spray Place 2 sprays into the nose daily. 16 g 2 Not Taking  . [DISCONTINUED] zolpidem (AMBIEN) 10 MG tablet Take 1 tablet (10 mg total) by mouth at bedtime as needed for sleep. 30 tablet 0   . [DISCONTINUED] zolpidem (AMBIEN) 10 MG tablet Take 1 tablet (10 mg total) by mouth at bedtime as needed for sleep. 15 tablet 1     Review of Systems  Constitutional: Negative for malaise/fatigue.  Gastrointestinal: Negative for abdominal pain.  Genitourinary:       Neg - vaginal bleeding, discharge   Physical Exam   Blood pressure 116/81, pulse 82, temperature 98.6 F (37 C), temperature source Oral, resp. rate 18, height 5' 6.5" (1.689 m), weight 196 lb 6.4 oz (89.086 kg).  Physical Exam  Nursing note and vitals reviewed. Constitutional: She is oriented to person, place, and time. She appears well-developed and well-nourished. No distress.  HENT:  Head: Normocephalic and atraumatic.  Cardiovascular: Normal rate.   Respiratory: Effort normal.  GI: Soft. She exhibits no distension and no mass. There is no tenderness. There is no rebound and no guarding.  Neurological: She is alert and oriented to person, place, and time.  Skin: Skin is warm and dry. No erythema.  Psychiatric: She has a normal mood and affect.    Fetal Monitoring started at 1736:  Baseline: 130 bpm, moderate variability, + accelerations, no decelerations Contractions: none  MAU Course  Procedures None  MDM Discussed patient with Dr. Rana Snare. Agrees with plan for 4 hours of fetal monitoring today.  2000 - Patient receiving extended fetal monitoring. Care turned over the  [facility], CNM FHR tracing has remained reactive Toco: no contractions  Marny Lowenstein, PA-C  06/10/2015, 8:08 PM  Assessment and Plan   1. Fall, initial encounter   2. [redacted] weeks gestation of pregnancy    DC home Comfort measures reviewed  3rd Trimester precautions  Abruption danger signs reviewed  PTL precautions  Fetal kick counts RX: none  Return to MAU as needed FU with OB as planned  Follow-up Information    Follow up with Turner Daniels, MD.   Specialty:  Obstetrics and Gynecology   Why:  As scheduled   Contact information:   [address] [place], [address] [place] [address] [phone]      Tawnya Crook 9:35 PM 06/10/2015

## 2015-06-10 NOTE — Discharge Instructions (Signed)
Third Trimester of Pregnancy The third trimester is from week 29 through week 42, months 7 through 9. The third trimester is a time when the fetus is growing rapidly. At the end of the ninth month, the fetus is about 20 inches in length and weighs 6-10 pounds.  BODY CHANGES Your body goes through many changes during pregnancy. The changes vary from woman to woman.   Your weight will continue to increase. You can expect to gain 25-35 pounds (11-16 kg) by the end of the pregnancy.  You may begin to get stretch marks on your hips, abdomen, and breasts.  You may urinate more often because the fetus is moving lower into your pelvis and pressing on your bladder.  You may develop or continue to have heartburn as a result of your pregnancy.  You may develop constipation because certain hormones are causing the muscles that push waste through your intestines to slow down.  You may develop hemorrhoids or swollen, bulging veins (varicose veins).  You may have pelvic pain because of the weight gain and pregnancy hormones relaxing your joints between the bones in your pelvis. Backaches may result from overexertion of the muscles supporting your posture.  You may have changes in your hair. These can include thickening of your hair, rapid growth, and changes in texture. Some women also have hair loss during or after pregnancy, or hair that feels dry or thin. Your hair will most likely return to normal after your baby is born.  Your breasts will continue to grow and be tender. A yellow discharge may leak from your breasts called colostrum.  Your belly button may stick out.  You may feel short of breath because of your expanding uterus.  You may notice the fetus "dropping," or moving lower in your abdomen.  You may have a bloody mucus discharge. This usually occurs a few days to a week before labor begins.  Your cervix becomes thin and soft (effaced) near your due date. WHAT TO EXPECT AT YOUR PRENATAL  EXAMS  You will have prenatal exams every 2 weeks until week 36. Then, you will have weekly prenatal exams. During a routine prenatal visit:  You will be weighed to make sure you and the fetus are growing normally.  Your blood pressure is taken.  Your abdomen will be measured to track your baby's growth.  The fetal heartbeat will be listened to.  Any test results from the previous visit will be discussed.  You may have a cervical check near your due date to see if you have effaced. At around 36 weeks, your caregiver will check your cervix. At the same time, your caregiver will also perform a test on the secretions of the vaginal tissue. This test is to determine if a type of bacteria, Group B streptococcus, is present. Your caregiver will explain this further. Your caregiver may ask you:  What your birth plan is.  How you are feeling.  If you are feeling the baby move.  If you have had any abnormal symptoms, such as leaking fluid, bleeding, severe headaches, or abdominal cramping.  If you have any questions. Other tests or screenings that may be performed during your third trimester include:  Blood tests that check for low iron levels (anemia).  Fetal testing to check the health, activity level, and growth of the fetus. Testing is done if you have certain medical conditions or if there are problems during the pregnancy. FALSE LABOR You may feel small, irregular contractions that   eventually go away. These are called Braxton Hicks contractions, or false labor. Contractions may last for hours, days, or even weeks before true labor sets in. If contractions come at regular intervals, intensify, or become painful, it is best to be seen by your caregiver.  SIGNS OF LABOR   Menstrual-like cramps.  Contractions that are 5 minutes apart or less.  Contractions that start on the top of the uterus and spread down to the lower abdomen and back.  A sense of increased pelvic pressure or back  pain.  A watery or bloody mucus discharge that comes from the vagina. If you have any of these signs before the 37th week of pregnancy, call your caregiver right away. You need to go to the hospital to get checked immediately. HOME CARE INSTRUCTIONS   Avoid all smoking, herbs, alcohol, and unprescribed drugs. These chemicals affect the formation and growth of the baby.  Follow your caregiver's instructions regarding medicine use. There are medicines that are either safe or unsafe to take during pregnancy.  Exercise only as directed by your caregiver. Experiencing uterine cramps is a good sign to stop exercising.  Continue to eat regular, healthy meals.  Wear a good support bra for breast tenderness.  Do not use hot tubs, steam rooms, or saunas.  Wear your seat belt at all times when driving.  Avoid raw meat, uncooked cheese, cat litter boxes, and soil used by cats. These carry germs that can cause birth defects in the baby.  Take your prenatal vitamins.  Try taking a stool softener (if your caregiver approves) if you develop constipation. Eat more high-fiber foods, such as fresh vegetables or fruit and whole grains. Drink plenty of fluids to keep your urine clear or pale yellow.  Take warm sitz baths to soothe any pain or discomfort caused by hemorrhoids. Use hemorrhoid cream if your caregiver approves.  If you develop varicose veins, wear support hose. Elevate your feet for 15 minutes, 3-4 times a day. Limit salt in your diet.  Avoid heavy lifting, wear low heal shoes, and practice good posture.  Rest a lot with your legs elevated if you have leg cramps or low back pain.  Visit your dentist if you have not gone during your pregnancy. Use a soft toothbrush to brush your teeth and be gentle when you floss.  A sexual relationship may be continued unless your caregiver directs you otherwise.  Do not travel far distances unless it is absolutely necessary and only with the approval  of your caregiver.  Take prenatal classes to understand, practice, and ask questions about the labor and delivery.  Make a trial run to the hospital.  Pack your hospital bag.  Prepare the baby's nursery.  Continue to go to all your prenatal visits as directed by your caregiver. SEEK MEDICAL CARE IF:  You are unsure if you are in labor or if your water has broken.  You have dizziness.  You have mild pelvic cramps, pelvic pressure, or nagging pain in your abdominal area.  You have persistent nausea, vomiting, or diarrhea.  You have a bad smelling vaginal discharge.  You have pain with urination. SEEK IMMEDIATE MEDICAL CARE IF:   You have a fever.  You are leaking fluid from your vagina.  You have spotting or bleeding from your vagina.  You have severe abdominal cramping or pain.  You have rapid weight loss or gain.  You have shortness of breath with chest pain.  You notice sudden or extreme swelling   of your face, hands, ankles, feet, or legs.  You have not felt your baby move in over an hour.  You have severe headaches that do not go away with medicine.  You have vision changes. Document Released: 12/05/2001 Document Revised: 12/16/2013 Document Reviewed: 02/11/2013 ExitCare Patient Information 2015 [employer]. This information is not intended to replace advice given to you by your health care provider. Make sure you discuss any questions you have with your health care provider.  

## 2015-06-22 ENCOUNTER — Encounter (HOSPITAL_COMMUNITY): Payer: Self-pay

## 2015-06-22 NOTE — Patient Instructions (Addendum)
   Your procedure is scheduled on: Friday, July 1  [facility] at: 12:30 PM Pick up the phone at the desk and dial [phone] and inform us of your arrival.  Please call this number if you have any problems the morning of surgery: [phone]  Remember: Do not eat food after midnight: Thursday Do not drink clear liquids after: 10 AM Friday, day of surgery Take these medicines the morning of surgery with a SIP OF WATER:  Protonix  Do not wear jewelry, make-up, or FINGER nail polish No metal in your hair or on your body. Do not wear lotions, powders, perfumes.  You may wear deodorant.  Do not bring valuables to the hospital.  Leave suitcase in the car. After Surgery it may be brought to your room. For patients being admitted to the hospital, checkout time is 11:00am the day of discharge.  Home with Husband Arlys JohnBrian cell [phone]

## 2015-06-23 ENCOUNTER — Encounter (HOSPITAL_COMMUNITY): Payer: Self-pay

## 2015-06-23 ENCOUNTER — Encounter (HOSPITAL_COMMUNITY)
Admission: RE | Admit: 2015-06-23 | Discharge: 2015-06-23 | Disposition: A | Discharge status: Home / Self Care | Payer: 59 | Source: Ambulatory Visit | Attending: Obstetrics and Gynecology | Admitting: Obstetrics and Gynecology

## 2015-06-23 HISTORY — DX: Gastro-esophageal reflux disease without esophagitis: K21.9

## 2015-06-23 LAB — CBC
HEMATOCRIT: 33.9 % — AB (ref 36.0–46.0)
Hemoglobin: 11.3 g/dL — ABNORMAL LOW (ref 12.0–15.0)
MCH: 29.8 pg (ref 26.0–34.0)
MCHC: 33.3 g/dL (ref 30.0–36.0)
MCV: 89.4 fL (ref 78.0–100.0)
Platelets: 252 10*3/uL (ref 150–400)
RBC: 3.79 MIL/uL — ABNORMAL LOW (ref 3.87–5.11)
RDW: 14.1 % (ref 11.5–15.5)
WBC: 11.9 10*3/uL — AB (ref 4.0–10.5)

## 2015-06-23 LAB — TYPE AND SCREEN
ABO/RH(D): O POS
Antibody Screen: NEGATIVE

## 2015-06-23 LAB — ABO/RH: ABO/RH(D): O POS

## 2015-06-24 LAB — RPR: RPR Ser Ql: NONREACTIVE

## 2015-06-24 MED ORDER — CEFAZOLIN SODIUM-DEXTROSE 2-3 GM-% IV SOLR
2.0000 g | INTRAVENOUS | Status: AC
  Administered 2015-06-25: 2 g via INTRAVENOUS

## 2015-06-25 ENCOUNTER — Encounter (HOSPITAL_COMMUNITY): Payer: Self-pay

## 2015-06-25 ENCOUNTER — Encounter (HOSPITAL_COMMUNITY)
Admission: RE | Disposition: A | Discharge status: Home / Self Care | Payer: Self-pay | Source: Ambulatory Visit | Attending: Obstetrics and Gynecology

## 2015-06-25 ENCOUNTER — Inpatient Hospital Stay (HOSPITAL_COMMUNITY)
Admission: RE | Admit: 2015-06-25 | Discharge: 2015-06-27 | DRG: 766 | Disposition: A | Discharge status: Home / Self Care | Payer: 59 | Source: Ambulatory Visit | Attending: Obstetrics and Gynecology | Admitting: Obstetrics and Gynecology

## 2015-06-25 ENCOUNTER — Inpatient Hospital Stay (HOSPITAL_COMMUNITY): Payer: 59 | Admitting: Anesthesiology

## 2015-06-25 DIAGNOSIS — F419 Anxiety disorder, unspecified: Secondary | ICD-10-CM

## 2015-06-25 DIAGNOSIS — Z823 Family history of stroke: Secondary | ICD-10-CM

## 2015-06-25 DIAGNOSIS — Z0000 Encounter for general adult medical examination without abnormal findings: Secondary | ICD-10-CM

## 2015-06-25 DIAGNOSIS — Z3A39 39 weeks gestation of pregnancy: Secondary | ICD-10-CM | POA: Diagnosis present

## 2015-06-25 DIAGNOSIS — O9962 Diseases of the digestive system complicating childbirth: Secondary | ICD-10-CM | POA: Diagnosis present

## 2015-06-25 DIAGNOSIS — O321XX0 Maternal care for breech presentation, not applicable or unspecified: Principal | ICD-10-CM | POA: Diagnosis present

## 2015-06-25 DIAGNOSIS — K219 Gastro-esophageal reflux disease without esophagitis: Secondary | ICD-10-CM | POA: Diagnosis present

## 2015-06-25 DIAGNOSIS — Z822 Family history of deafness and hearing loss: Secondary | ICD-10-CM | POA: Diagnosis not present

## 2015-06-25 SURGERY — Surgical Case
Anesthesia: Spinal | Site: Abdomen

## 2015-06-25 MED ORDER — WITCH HAZEL-GLYCERIN EX PADS: 1.0000 "application " | MEDICATED_PAD | CUTANEOUS | Status: DC | PRN

## 2015-06-25 MED ORDER — LACTATED RINGERS IV SOLN
INTRAVENOUS | Status: DC
Start: 2015-06-25 — End: 2015-06-27
  Administered 2015-06-25: 21:00:00 via INTRAVENOUS

## 2015-06-25 MED ORDER — ACETAMINOPHEN 325 MG PO TABS
650.0000 mg | ORAL_TABLET | ORAL | Status: DC | PRN
  Administered 2015-06-25: 650 mg via ORAL
  Filled 2015-06-25: qty 2

## 2015-06-25 MED ORDER — ONDANSETRON HCL 4 MG/2ML IJ SOLN
INTRAMUSCULAR | Status: AC
  Filled 2015-06-25: qty 2

## 2015-06-25 MED ORDER — OXYCODONE HCL 5 MG PO TABS
5.0000 mg | ORAL_TABLET | Freq: Once | ORAL | Status: AC
  Administered 2015-06-25: 5 mg via ORAL
  Filled 2015-06-25: qty 1

## 2015-06-25 MED ORDER — DEXTROSE 5 % IV SOLN: 1.0000 ug/kg/h | INTRAVENOUS | Status: DC | PRN

## 2015-06-25 MED ORDER — SENNOSIDES-DOCUSATE SODIUM 8.6-50 MG PO TABS
2.0000 | ORAL_TABLET | ORAL | Status: DC
  Administered 2015-06-26: 2 via ORAL
  Filled 2015-06-25: qty 2

## 2015-06-25 MED ORDER — MORPHINE SULFATE (PF) 0.5 MG/ML IJ SOLN
INTRAMUSCULAR | Status: DC | PRN
Start: 2015-06-25 — End: 2015-06-25
  Administered 2015-06-25: .2 mg via EPIDURAL

## 2015-06-25 MED ORDER — SIMETHICONE 80 MG PO CHEW
80.0000 mg | CHEWABLE_TABLET | Freq: Three times a day (TID) | ORAL | Status: DC
  Administered 2015-06-25 – 2015-06-26 (×3): 80 mg via ORAL
  Filled 2015-06-25 (×4): qty 1

## 2015-06-25 MED ORDER — OXYTOCIN 10 UNIT/ML IJ SOLN
40.0000 [IU] | INTRAVENOUS | Status: DC | PRN
  Administered 2015-06-25: 40 [IU] via INTRAVENOUS

## 2015-06-25 MED ORDER — PHENYLEPHRINE 8 MG IN D5W 100 ML (0.08MG/ML) PREMIX OPTIME
INJECTION | INTRAVENOUS | Status: DC | PRN
  Administered 2015-06-25: 60 ug/min via INTRAVENOUS

## 2015-06-25 MED ORDER — LACTATED RINGERS IV SOLN
INTRAVENOUS | Status: DC | PRN
Start: 2015-06-25 — End: 2015-06-25
  Administered 2015-06-25: 14:00:00 via INTRAVENOUS

## 2015-06-25 MED ORDER — OXYCODONE-ACETAMINOPHEN 5-325 MG PO TABS
1.0000 | ORAL_TABLET | ORAL | Status: DC | PRN
  Administered 2015-06-26 – 2015-06-27 (×4): 1 via ORAL
  Filled 2015-06-25 (×4): qty 1

## 2015-06-25 MED ORDER — NALBUPHINE HCL 10 MG/ML IJ SOLN: 5.0000 mg | Freq: Once | INTRAMUSCULAR | Status: AC | PRN

## 2015-06-25 MED ORDER — SIMETHICONE 80 MG PO CHEW
80.0000 mg | CHEWABLE_TABLET | ORAL | Status: DC
  Administered 2015-06-26 – 2015-06-27 (×2): 80 mg via ORAL
  Filled 2015-06-25: qty 1

## 2015-06-25 MED ORDER — ONDANSETRON HCL 4 MG/2ML IJ SOLN: 4.0000 mg | Freq: Three times a day (TID) | INTRAMUSCULAR | Status: DC | PRN

## 2015-06-25 MED ORDER — BUPIVACAINE IN DEXTROSE 0.75-8.25 % IT SOLN
INTRATHECAL | Status: DC | PRN
  Administered 2015-06-25: 1.6 mg via INTRATHECAL

## 2015-06-25 MED ORDER — DIBUCAINE 1 % RE OINT: 1.0000 "application " | TOPICAL_OINTMENT | RECTAL | Status: DC | PRN

## 2015-06-25 MED ORDER — OXYCODONE-ACETAMINOPHEN 5-325 MG PO TABS
2.0000 | ORAL_TABLET | ORAL | Status: DC | PRN
  Administered 2015-06-26: 2 via ORAL
  Administered 2015-06-26: 1 via ORAL
  Administered 2015-06-26 – 2015-06-27 (×3): 2 via ORAL
  Filled 2015-06-25 (×5): qty 2

## 2015-06-25 MED ORDER — DIPHENHYDRAMINE HCL 50 MG/ML IJ SOLN: 12.5000 mg | INTRAMUSCULAR | Status: DC | PRN

## 2015-06-25 MED ORDER — LACTATED RINGERS IV SOLN
INTRAVENOUS | Status: DC
Start: 2015-06-25 — End: 2015-06-25
  Administered 2015-06-25: 125 mL/h via INTRAVENOUS
  Administered 2015-06-25: 14:00:00 via INTRAVENOUS

## 2015-06-25 MED ORDER — ONDANSETRON HCL 4 MG/2ML IJ SOLN: 4.0000 mg | Freq: Once | INTRAMUSCULAR | Status: DC | PRN

## 2015-06-25 MED ORDER — DIPHENHYDRAMINE HCL 25 MG PO CAPS: 25.0000 mg | ORAL_CAPSULE | ORAL | Status: DC | PRN

## 2015-06-25 MED ORDER — SODIUM CHLORIDE 0.9 % IJ SOLN: 3.0000 mL | INTRAMUSCULAR | Status: DC | PRN

## 2015-06-25 MED ORDER — FENTANYL CITRATE (PF) 100 MCG/2ML IJ SOLN
25.0000 ug | INTRAMUSCULAR | Status: DC | PRN
  Administered 2015-06-25 (×2): 50 ug via INTRAVENOUS

## 2015-06-25 MED ORDER — ZOLPIDEM TARTRATE 5 MG PO TABS: 5.0000 mg | ORAL_TABLET | Freq: Every evening | ORAL | Status: DC | PRN

## 2015-06-25 MED ORDER — NALOXONE HCL 0.4 MG/ML IJ SOLN: 0.4000 mg | INTRAMUSCULAR | Status: DC | PRN

## 2015-06-25 MED ORDER — PHENYLEPHRINE 8 MG IN D5W 100 ML (0.08MG/ML) PREMIX OPTIME
INJECTION | INTRAVENOUS | Status: AC
  Filled 2015-06-25: qty 100

## 2015-06-25 MED ORDER — DIPHENHYDRAMINE HCL 25 MG PO CAPS: 25.0000 mg | ORAL_CAPSULE | Freq: Four times a day (QID) | ORAL | Status: DC | PRN

## 2015-06-25 MED ORDER — SCOPOLAMINE 1 MG/3DAYS TD PT72
MEDICATED_PATCH | TRANSDERMAL | Status: AC
  Administered 2015-06-25: 1.5 mg via TRANSDERMAL
  Filled 2015-06-25: qty 1

## 2015-06-25 MED ORDER — MENTHOL 3 MG MT LOZG: 1.0000 | LOZENGE | OROMUCOSAL | Status: DC | PRN

## 2015-06-25 MED ORDER — NALBUPHINE HCL 10 MG/ML IJ SOLN: 5.0000 mg | INTRAMUSCULAR | Status: DC | PRN

## 2015-06-25 MED ORDER — FENTANYL CITRATE (PF) 100 MCG/2ML IJ SOLN
INTRAMUSCULAR | Status: AC
  Filled 2015-06-25: qty 2

## 2015-06-25 MED ORDER — KETOROLAC TROMETHAMINE 30 MG/ML IJ SOLN
INTRAMUSCULAR | Status: AC
  Filled 2015-06-25: qty 1

## 2015-06-25 MED ORDER — ONDANSETRON HCL 4 MG/2ML IJ SOLN
INTRAMUSCULAR | Status: DC | PRN
  Administered 2015-06-25: 4 mg via INTRAVENOUS

## 2015-06-25 MED ORDER — FENTANYL CITRATE (PF) 100 MCG/2ML IJ SOLN
INTRAMUSCULAR | Status: DC | PRN
Start: 2015-06-25 — End: 2015-06-25
  Administered 2015-06-25: 10 ug via INTRATHECAL

## 2015-06-25 MED ORDER — OXYTOCIN 10 UNIT/ML IJ SOLN
INTRAMUSCULAR | Status: AC
  Filled 2015-06-25: qty 4

## 2015-06-25 MED ORDER — TETANUS-DIPHTH-ACELL PERTUSSIS 5-2.5-18.5 LF-MCG/0.5 IM SUSP: 0.5000 mL | Freq: Once | INTRAMUSCULAR | Status: DC

## 2015-06-25 MED ORDER — CEFAZOLIN SODIUM-DEXTROSE 2-3 GM-% IV SOLR
INTRAVENOUS | Status: AC
  Filled 2015-06-25: qty 50

## 2015-06-25 MED ORDER — SCOPOLAMINE 1 MG/3DAYS TD PT72: 1.0000 | MEDICATED_PATCH | Freq: Once | TRANSDERMAL | Status: DC

## 2015-06-25 MED ORDER — SCOPOLAMINE 1 MG/3DAYS TD PT72
1.0000 | MEDICATED_PATCH | Freq: Once | TRANSDERMAL | Status: DC
  Administered 2015-06-25: 1.5 mg via TRANSDERMAL

## 2015-06-25 MED ORDER — OXYTOCIN 40 UNITS IN LACTATED RINGERS INFUSION - SIMPLE MED: 62.5000 mL/h | INTRAVENOUS | Status: AC

## 2015-06-25 MED ORDER — LANOLIN HYDROUS EX OINT: 1.0000 "application " | TOPICAL_OINTMENT | CUTANEOUS | Status: DC | PRN

## 2015-06-25 MED ORDER — KETOROLAC TROMETHAMINE 30 MG/ML IJ SOLN
30.0000 mg | Freq: Four times a day (QID) | INTRAMUSCULAR | Status: AC | PRN
  Administered 2015-06-25: 30 mg via INTRAVENOUS
  Filled 2015-06-25: qty 1

## 2015-06-25 MED ORDER — MORPHINE SULFATE 0.5 MG/ML IJ SOLN
INTRAMUSCULAR | Status: AC
  Filled 2015-06-25: qty 10

## 2015-06-25 MED ORDER — PRENATAL MULTIVITAMIN CH
1.0000 | ORAL_TABLET | Freq: Every day | ORAL | Status: DC
  Administered 2015-06-26 – 2015-06-27 (×2): 1 via ORAL
  Filled 2015-06-25 (×2): qty 1

## 2015-06-25 MED ORDER — 0.9 % SODIUM CHLORIDE (POUR BTL) OPTIME
TOPICAL | Status: DC | PRN
  Administered 2015-06-25: 1000 mL

## 2015-06-25 MED ORDER — SIMETHICONE 80 MG PO CHEW: 80.0000 mg | CHEWABLE_TABLET | ORAL | Status: DC | PRN

## 2015-06-25 MED ORDER — KETOROLAC TROMETHAMINE 30 MG/ML IJ SOLN
30.0000 mg | Freq: Four times a day (QID) | INTRAMUSCULAR | Status: AC | PRN
  Administered 2015-06-25: 30 mg via INTRAMUSCULAR

## 2015-06-25 MED ORDER — MEPERIDINE HCL 25 MG/ML IJ SOLN: 6.2500 mg | INTRAMUSCULAR | Status: DC | PRN

## 2015-06-25 MED ORDER — IBUPROFEN 600 MG PO TABS
600.0000 mg | ORAL_TABLET | Freq: Four times a day (QID) | ORAL | Status: DC
  Administered 2015-06-26 – 2015-06-27 (×6): 600 mg via ORAL
  Filled 2015-06-25 (×6): qty 1

## 2015-06-25 SURGICAL SUPPLY — 29 items
BENZOIN TINCTURE PRP APPL 2/3 (GAUZE/BANDAGES/DRESSINGS) ×3 IMPLANT
CLAMP CORD UMBIL (MISCELLANEOUS) ×3 IMPLANT
CLOSURE WOUND 1/2 X4 (GAUZE/BANDAGES/DRESSINGS) ×1
CLOTH BEACON ORANGE TIMEOUT ST (SAFETY) ×3 IMPLANT
DRAPE SHEET LG 3/4 BI-LAMINATE (DRAPES) ×3 IMPLANT
DRSG OPSITE POSTOP 4X10 (GAUZE/BANDAGES/DRESSINGS) ×3 IMPLANT
DURAPREP 26ML APPLICATOR (WOUND CARE) ×3 IMPLANT
ELECT REM PT RETURN 9FT ADLT (ELECTROSURGICAL) ×3
ELECTRODE REM PT RTRN 9FT ADLT (ELECTROSURGICAL) ×1 IMPLANT
GLOVE BIO SURGEON STRL SZ7.5 (GLOVE) ×3 IMPLANT
GOWN STRL REUS W/TWL LRG LVL3 (GOWN DISPOSABLE) ×6 IMPLANT
KIT ABG SYR 3ML LUER SLIP (SYRINGE) ×3 IMPLANT
NEEDLE HYPO 21X1.5 SAFETY (NEEDLE) ×3 IMPLANT
NEEDLE HYPO 25X5/8 SAFETYGLIDE (NEEDLE) ×3 IMPLANT
NS IRRIG 1000ML POUR BTL (IV SOLUTION) ×3 IMPLANT
PACK C SECTION WH (CUSTOM PROCEDURE TRAY) ×3 IMPLANT
PAD ABD 7.5X8 STRL (GAUZE/BANDAGES/DRESSINGS) ×3 IMPLANT
PAD OB MATERNITY 4.3X12.25 (PERSONAL CARE ITEMS) ×3 IMPLANT
SPONGE GAUZE 4X4 12PLY STER LF (GAUZE/BANDAGES/DRESSINGS) ×6 IMPLANT
STRIP CLOSURE SKIN 1/2X4 (GAUZE/BANDAGES/DRESSINGS) ×2 IMPLANT
SUT MNCRL 0 VIOLET CTX 36 (SUTURE) ×4 IMPLANT
SUT MONOCRYL 0 CTX 36 (SUTURE) ×8
SUT PDS AB 0 CTX 60 (SUTURE) ×3 IMPLANT
SUT PLAIN 2 0 XLH (SUTURE) ×3 IMPLANT
SUT VIC AB 4-0 KS 27 (SUTURE) ×3 IMPLANT
SYR 30ML LL (SYRINGE) ×3 IMPLANT
TAPE CLOTH SURG 4X10 WHT LF (GAUZE/BANDAGES/DRESSINGS) ×3 IMPLANT
TOWEL OR 17X24 6PK STRL BLUE (TOWEL DISPOSABLE) ×3 IMPLANT
TRAY FOLEY CATH SILVER 14FR (SET/KITS/TRAYS/PACK) ×3 IMPLANT

## 2015-06-25 NOTE — Brief Op Note (Signed)
06/25/2015  2:54 PM  PATIENT:  Caitlin Christensen  34 y.o. female  PRE-OPERATIVE DIAGNOSIS:  breech  POST-OPERATIVE DIAGNOSIS:  breech  PROCEDURE:  Procedure(s) with comments: CESAREAN SECTION (N/A) - primary edc 06/29/15 NKDA  SURGEON:  Surgeon(s) and Role:    * Harold HedgeJames Areli Frary, MD - Primary  PHYSICIAN ASSISTANT:   ASSISTANTS: none   ANESTHESIA:   spinal  EBL:  Total I/O In: 1900 [I.V.:1900] Out: 1100 [Urine:600; Blood:500]  BLOOD ADMINISTERED:none  DRAINS: Urinary Catheter (Foley)   LOCAL MEDICATIONS USED:  NONE  SPECIMEN:  No Specimen  DISPOSITION OF SPECIMEN:  N/A  COUNTS:  YES  TOURNIQUET:  * No tourniquets in log *  DICTATION: .Other Dictation: Dictation Number [id number]  PLAN OF CARE: Admit to inpatient   PATIENT DISPOSITION:  PACU - hemodynamically stable.   Delay start of Pharmacological VTE agent (>24hrs) due to surgical blood loss or risk of bleeding: not applicable

## 2015-06-25 NOTE — Progress Notes (Signed)
No changes to H&P per patient history Reviewed with patient procedure-cesarean section All questions answered Patient states she understands and agrees

## 2015-06-25 NOTE — Anesthesia Postprocedure Evaluation (Signed)
  Anesthesia Post-op Note  Patient: Caitlin ParisChristina J Schirmer  Procedure(s) Performed: Procedure(s) (LRB): CESAREAN SECTION (N/A)  Patient Location: PACU  Anesthesia Type: Spinal  Level of Consciousness: awake and alert   Airway and Oxygen Therapy: Patient Spontanous Breathing  Post-op Pain: mild  Post-op Assessment: Post-op Vital signs reviewed, Patient's Cardiovascular Status Stable, Respiratory Function Stable, Patent Airway and No signs of Nausea or vomiting  Last Vitals:  Filed Vitals:   06/25/15 1530  BP: 117/71  Pulse: 81  Temp:   Resp: 13    Post-op Vital Signs: stable   Complications: No apparent anesthesia complications

## 2015-06-25 NOTE — Anesthesia Procedure Notes (Signed)
Spinal Patient location during procedure: OR Staffing Anesthesiologist: Markiah Janeway Performed by: anesthesiologist  Preanesthetic Checklist Completed: patient identified, site marked, surgical consent, pre-op evaluation, timeout performed, IV checked, risks and benefits discussed and monitors and equipment checked Spinal Block Patient position: sitting Prep: ChloraPrep Patient monitoring: continuous pulse ox, blood pressure and heart rate Approach: midline Location: L3-4 Injection technique: single-shot Needle Needle type: Sprotte  Needle gauge: 24 G Needle length: 9 cm Additional Notes Functioning IV was confirmed and monitors were applied. Sterile prep and drape, including hand hygiene, mask and sterile gloves were used. The patient was positioned and the spine was prepped. The skin was anesthetized with lidocaine.  Free flow of clear CSF was obtained prior to injecting local anesthetic into the CSF.  The spinal needle aspirated freely following injection.  The needle was carefully withdrawn.  The patient tolerated the procedure well. Consent was obtained prior to procedure with all questions answered and concerns addressed. Risks including but not limited to bleeding, infection, nerve damage, paralysis, failed block, inadequate analgesia, allergic reaction, high spinal, itching and headache were discussed and the patient wished to proceed.   Tacey Dimaggio, MD     

## 2015-06-25 NOTE — Transfer of Care (Signed)
Immediate Anesthesia Transfer of Care Note  Patient: Caitlin ParisChristina J Mulroy  Procedure(s) Performed: Procedure(s) with comments: CESAREAN SECTION (N/A) - primary edc 06/29/15 NKDA  Patient Location: PACU  Anesthesia Type:Spinal  Level of Consciousness: awake  Airway & Oxygen Therapy: Patient Spontanous Breathing  Post-op Assessment: Report given to RN  Post vital signs: Reviewed and stable  Last Vitals:  Filed Vitals:   06/25/15 1247  BP: 138/83  Pulse: 95  Temp: 36.9 C  Resp: 16    Complications: No apparent anesthesia complications

## 2015-06-25 NOTE — Anesthesia Preprocedure Evaluation (Signed)
Anesthesia Evaluation  Patient identified by MRN, date of birth, ID band Patient awake    Reviewed: Allergy & Precautions, NPO status , Patient's Chart, lab work & pertinent test results  History of Anesthesia Complications Negative for: history of anesthetic complications  Airway Mallampati: II  TM Distance: >3 FB Neck ROM: Full    Dental no notable dental hx. (+) Dental Advisory Given   Pulmonary neg pulmonary ROS,  breath sounds clear to auscultation  Pulmonary exam normal       Cardiovascular negative cardio ROS Normal cardiovascular examRhythm:Regular Rate:Normal     Neuro/Psych negative neurological ROS  negative psych ROS   GI/Hepatic Neg liver ROS, GERD-  Medicated and Controlled,  Endo/Other  negative endocrine ROS  Renal/GU negative Renal ROS  negative genitourinary   Musculoskeletal negative musculoskeletal ROS (+)   Abdominal   Peds negative pediatric ROS (+)  Hematology negative hematology ROS (+)   Anesthesia Other Findings   Reproductive/Obstetrics (+) Pregnancy                             Anesthesia Physical Anesthesia Plan  ASA: II  Anesthesia Plan: Spinal   Post-op Pain Management:    Induction:   Airway Management Planned:   Additional Equipment:   Intra-op Plan:   Post-operative Plan:   Informed Consent: I have reviewed the patients History and Physical, chart, labs and discussed the procedure including the risks, benefits and alternatives for the proposed anesthesia with the patient or authorized representative who has indicated his/her understanding and acceptance.   Dental advisory given  Plan Discussed with: CRNA  Anesthesia Plan Comments:         Anesthesia Quick Evaluation

## 2015-06-26 LAB — CBC
HCT: 28.4 % — ABNORMAL LOW (ref 36.0–46.0)
Hemoglobin: 9.4 g/dL — ABNORMAL LOW (ref 12.0–15.0)
MCH: 29.7 pg (ref 26.0–34.0)
MCHC: 33.1 g/dL (ref 30.0–36.0)
MCV: 89.9 fL (ref 78.0–100.0)
Platelets: 225 10*3/uL (ref 150–400)
RBC: 3.16 MIL/uL — ABNORMAL LOW (ref 3.87–5.11)
RDW: 14.3 % (ref 11.5–15.5)
WBC: 10.2 10*3/uL (ref 4.0–10.5)

## 2015-06-26 MED ORDER — SENNOSIDES-DOCUSATE SODIUM 8.6-50 MG PO TABS
2.0000 | ORAL_TABLET | Freq: Two times a day (BID) | ORAL | Status: DC
  Administered 2015-06-26 – 2015-06-27 (×3): 2 via ORAL
  Filled 2015-06-26 (×3): qty 2

## 2015-06-26 NOTE — Addendum Note (Signed)
Addendum  created 06/26/15 1038 by Algis GreenhouseLinda A Caree Wolpert, CRNA   Modules edited: Notes Section   Notes Section:  File: [id number]

## 2015-06-26 NOTE — Lactation Note (Signed)
This note was copied from the chart of Caitlin Christensen. Lactation Consultation Note; Initial visit with mom. Mom has bruising noted on both areola especially the right breast. Reports baby has been sleepy at the breast. Attempted to latch baby- unwrapped and placed skin to skin, she would take a few sucks then off to sleep. Reviewed basic teaching with parents including feeding cues and cluster feeding. No questions at present. BF brochure given with resources for support after DC. To call for assist prn  Patient Name: Caitlin Luvenia HellerChristina Maus ZOXWR'UToday's Date: 06/26/2015 Reason for consult: Initial assessment   Maternal Data Formula Feeding for Exclusion: No Has patient been taught Hand Expression?: Yes Does the patient have breastfeeding experience prior to this delivery?: No  Feeding Feeding Type: Breast Fed  LATCH Score/Interventions Latch: Repeated attempts needed to sustain latch, nipple held in mouth throughout feeding, stimulation needed to elicit sucking reflex.  Audible Swallowing: None  Type of Nipple: Everted at rest and after stimulation  Comfort (Breast/Nipple): Filling, red/small blisters or bruises, mild/mod discomfort  Problem noted: Mild/Moderate discomfort (bruising noted on both areola)  Hold (Positioning): Assistance needed to correctly position infant at breast and maintain latch. Intervention(s): Breastfeeding basics reviewed  LATCH Score: 5  Lactation Tools Discussed/Used WIC Program: No   Consult Status Consult Status: Follow-up Date: 06/27/15 Follow-up type: In-patient    Pamelia HoitWeeks, Itay Mella D 06/26/2015, 2:53 PM

## 2015-06-26 NOTE — Op Note (Signed)
[name]:  Caitlin Christensen, Caitlin Christensen             ACCOUNT NO.:  [id number]  MEDICAL RECORD NO.:  [id number]  LOCATION:  9111                          FACILITY:  [facility]  PHYSICIAN:  Guy SandiferJames E. Henderson Cloudomblin, M.D. DATE OF BIRTH:  [birth year]  DATE OF PROCEDURE:  06/25/2015 DATE OF DISCHARGE:                              OPERATIVE REPORT   PREOPERATIVE DIAGNOSES: 1. Intrauterine pregnancy at 39-3/7 weeks. 2. Breech.  POSTOPERATIVE DIAGNOSES: 1. Intrauterine pregnancy at 39-3/7 weeks. 2. Breech.  PROCEDURE:  Primary low-transverse cesarean section.  SURGEON:  Guy SandiferJames E. Diamonds Lippard, MD  ANESTHESIA:  Spinal.  ESTIMATED BLOOD LOSS:  500 mL.  FINDINGS:  Viable female infant.  Apgars, arterial cord pH, and weight pending.  SPECIMENS:  None.  INDICATIONS AND CONSENT:  This patient is a 34 year old, G1, P0, 39-3/7 weeks with known breech.  Breech was confirmed in the preop holding area.  Cesarean section was reviewed.  Potential risks, complication have been reviewed preoperatively including, but not limited to, infection, organ damage, bleeding requiring transfusion of blood products with HIV and hepatitis acquisition, DVT, PE, pneumonia.  All questions were answered.  The patient states she understands and agrees and consent was signed on the chart.  PROCEDURE IN DETAIL:  The patient was taken to the operating room where she was identified and spinal anesthetic was placed.  She was placed in a dorsal supine position with a 15-degree left lateral wedge.  Foley catheter was placed.  She was prepped and draped in a sterile fashion per [facility] protocol.  After testing for adequate spinal anesthesia, skin was entered through a Pfannenstiel incision and dissection was carried out in layers to the peritoneum.  Peritoneum was incised extended superiorly and inferiorly.  Vesicouterine peritoneum was taken down cephalolaterally.  Bladder flap was developed and the bladder blade was placed.  Uterus was  incised in a low transverse manner and the uterine cavity was entered bluntly with a hemostat.  Clear fluid was noted.  The uterine incision was extended cephalolaterally.  Baby was delivered from the frank breech position without difficulty.  Good cry and tone was noted.  Cord was clamped and cut and the baby was handed to awaiting pediatrics team.  Placenta was manually delivered. Uterus was clean.  Uterus was closed in 2 running locking imbricating layers of 0 Monocryl suture, which achieves good hemostasis.  Lavage was carried out.  Anterior peritoneum was closed in running fashion with 0 Monocryl suture, which was also used to reapproximate the pyramidalis muscle in the midline.  Anterior fascia was closed in a running fashion with a 0 looped PDS suture.  Subcutaneous layer was closed with interrupted plain and the skin was closed in a subcuticular fashion with 4-0 Vicryl on a Keith needle.  Benzoin, Steri-Strips, and pressure dressing were applied.  All counts were correct.  The patient was taken to recovery room in stable condition.     Guy SandiferJames E. Henderson Cloudomblin, M.D.     JET/MEDQ  D:  06/25/2015  T:  06/26/2015  Job:  [id number]

## 2015-06-26 NOTE — Progress Notes (Signed)
Patient is doing well. No complaints  BP 101/65 mmHg  Pulse 75  Temp(Src) 97.8 F (36.6 C) (Oral)  Resp 20  SpO2 100%  Breastfeeding? Unknown Results for orders placed or performed during the hospital encounter of 06/25/15 (from the past 24 hour(s))  CBC     Status: Abnormal   Collection Time: 06/26/15  5:45 AM  Result Value Ref Range   WBC 10.2 4.0 - 10.5 K/uL   RBC 3.16 (L) 3.87 - 5.11 MIL/uL   Hemoglobin 9.4 (L) 12.0 - 15.0 g/dL   HCT 95.228.4 (L) 84.136.0 - 32.446.0 %   MCV 89.9 78.0 - 100.0 fL   MCH 29.7 26.0 - 34.0 pg   MCHC 33.1 30.0 - 36.0 g/dL   RDW 40.114.3 02.711.5 - 25.315.5 %   Platelets 225 150 - 400 K/uL   General alert and oriented Lung CTAB CAR RRR Abdomen is soft and non tender Incision clean dry and intact  IMPRESSION: POD #1 Doing well Routine care

## 2015-06-26 NOTE — Anesthesia Postprocedure Evaluation (Signed)
Anesthesia Post Note  Patient: Caitlin Christensen  Procedure(s) Performed: Procedure(s) (LRB): CESAREAN SECTION (N/A)  Anesthesia type: Spinal  Patient location: Mother/Baby  Post pain: Pain level controlled  Post assessment: Post-op Vital signs reviewed  Last Vitals:  Filed Vitals:   06/26/15 0846  BP: 113/80  Pulse: 83  Temp: 36.9 C  Resp: 20    Post vital signs: Reviewed  Level of consciousness: awake  Complications: No apparent anesthesia complications

## 2015-06-27 MED ORDER — MEASLES, MUMPS & RUBELLA VAC ~~LOC~~ INJ
0.5000 mL | INJECTION | Freq: Once | SUBCUTANEOUS | Status: AC
  Administered 2015-06-27: 0.5 mL via SUBCUTANEOUS
  Filled 2015-06-27 (×2): qty 0.5

## 2015-06-27 MED ORDER — IBUPROFEN 600 MG PO TABS: 600.0000 mg | ORAL_TABLET | Freq: Four times a day (QID) | ORAL | Status: DC

## 2015-06-27 MED ORDER — OXYCODONE-ACETAMINOPHEN 5-325 MG PO TABS: 1.0000 | ORAL_TABLET | ORAL | Status: DC | PRN

## 2015-06-27 NOTE — Discharge Summary (Signed)
Obstetric Discharge Summary Reason for Admission: cesarean section Prenatal Procedures: none Intrapartum Procedures: cesarean: low cervical, transverse Postpartum Procedures: none Complications-Operative and Postpartum: none HEMOGLOBIN  Date Value Ref Range Status  06/26/2015 9.4* 12.0 - 15.0 g/dL Final   HCT  Date Value Ref Range Status  06/26/2015 28.4* 36.0 - 46.0 % Final    Physical Exam:  General: alert and cooperative Lochia: appropriate Uterine Fundus: firm Incision: healing well, no significant drainage, no dehiscence DVT Evaluation: No evidence of DVT seen on physical exam.  Discharge Diagnoses: Term Pregnancy-delivered  Discharge Information: Date: 06/27/2015 Activity: pelvic rest Diet: routine Medications: Ibuprofen and Percocet Condition: improved Instructions: refer to practice specific booklet Discharge to: home   Newborn Data: Live born female  Birth Weight: 7 lb 9.7 oz (3450 g) APGAR: 8, 9  Home with mother.  Cortney Beissel L 06/27/2015, 8:54 AM

## 2015-06-27 NOTE — Lactation Note (Signed)
This note was copied from the chart of Caitlin Luvenia HellerChristina Bartolini. Lactation Consultation Note; Mom had difficulty with latch throughout the night. Baby was very fussy and they gave some formula and had baby taken to nursery because they were so tired. NP assisted mom with latch with NS this morning. DEBP given to mom with instructions for setup, use and cleaning. Mom pumped for 15 mi- no Colostrum obtained but nipples more erect. Baby waking Latched well for 20 min, swallows noted and mom reports no pain- only tugging. Parents reports this is the most successful feeding they have had. IN shells in room- reviewed use and mom put them on after nursing. OP appointment made for Wed 7/6 at 4 pm. Possible DC this afternoon. Encouraged to get at least another feeding in to make sure they can do it by themselves. No questions at present. To call prn  Patient Name: Caitlin Christensen ZOXWR'UToday's Date: 06/27/2015 Reason for consult: Follow-up assessment   Maternal Data Formula Feeding for Exclusion: No Has patient been taught Hand Expression?: Yes Does the patient have breastfeeding experience prior to this delivery?: No  Feeding Feeding Type: Breast Fed Length of feed: 20 min  LATCH Score/Interventions Latch: Grasps breast easily, tongue down, lips flanged, rhythmical sucking. Intervention(s): Adjust position;Assist with latch;Breast compression  Audible Swallowing: A few with stimulation  Type of Nipple: Everted at rest and after stimulation Intervention(s): Shells  Comfort (Breast/Nipple): Filling, red/small blisters or bruises, mild/mod discomfort  Problem noted: Mild/Moderate discomfort Interventions (Mild/moderate discomfort): Comfort gels  Hold (Positioning): Assistance needed to correctly position infant at breast and maintain latch. Intervention(s): Breastfeeding basics reviewed  LATCH Score: 7  Lactation Tools Discussed/Used WIC Program: No Pump Review: Setup, frequency, and  cleaning;Milk Storage Initiated by:: DW Date initiated:: 06/27/15   Consult Status      Pamelia HoitWeeks, Caitlin Christensen 06/27/2015, 11:55 AM

## 2015-06-28 ENCOUNTER — Encounter (HOSPITAL_COMMUNITY): Payer: Self-pay | Admitting: Obstetrics and Gynecology

## 2015-06-30 ENCOUNTER — Ambulatory Visit (HOSPITAL_COMMUNITY)
Admit: 2015-06-30 | Discharge: 2015-06-30 | Disposition: A | Discharge status: Home / Self Care | Payer: 59 | Attending: Obstetrics and Gynecology | Admitting: Obstetrics and Gynecology

## 2015-06-30 NOTE — Lactation Note (Signed)
Lactation Consult  Mother's reason for visit:  Assist with feeding Visit Type:  Feeding assessment Appointment Notes:  Sore nipples in hospital, using NS Consult:  Initial Lactation Consultant:  Audry Riles D  ________________________________________________________________________    ________________________________________________________________________  Mother's Name: Caitlin Christensen Type of delivery:   Breastfeeding Experience:  P1  Maternal Medications:  Percocet, Prenatal vitamins  ________________________________________________________________________  Breastfeeding History (Post Discharge)  Frequency of breastfeeding:  q 3 hours Duration of feeding:  30-45  Supplementation  Formula:  Volume 60 ml Frequency:  q feeding       Brand: Similac  Breastmilk:  Volume 15 ml Frequency:  whenever available  Method:  Bottle,   Pumping  Type of pump:  Medela pump in style Frequency:  q 3 hours Volume:  15 ml  Infant Intake and Output Assessment  Voids:  7-8 in 24 hrs.  Color:  Clear yellow Stools:  3-4 in 24 hrs.  Color:  Brown and Yellow  ________________________________________________________________________  Maternal Breast Assessment  Breast:  Filling Nipple:  Erect   _______________________________________________________________________ Feeding Assessment/Evaluation  Initial feeding assessment:    Positioning:  Football Right breast  LATCH documentation:  Latch:  1 = Repeated attempts needed to sustain latch, nipple held in mouth throughout feeding, stimulation needed to elicit sucking reflex.  Audible swallowing:  0 = None  Type of nipple:  2 = Everted at rest and after stimulation  Comfort (Breast/Nipple):  2 = Soft / non-tender- healing  Hold (Positioning):  1 = Assistance needed to correctly position infant at breast and maintain latch  LATCH score:  6  Attached assessment:  Deep  Lips flanged:  Yes.    Lips untucked:  Yes.    Suck  assessment:  Nonnutritive- very sleepy at the breast  Tools:  Nipple shield 24 mm Instructed on use and cleaning of tool:  Yes.    Pre-feed weight:  3346 g  (7 lb. 6 oz.) Post-feed weight:  3350 g (7 lb. 6.1 oz.) Amount transferred:  4 ml Amount supplemented:  0 ml  Additional Feeding Assessment -    Positioning:  Football Left breast  LATCH documentation:  Latch:  2 = Grasps breast easily, tongue down, lips flanged, rhythmical sucking.  Audible swallowing:  1 = A few with stimulation  Type of nipple:  2 = Everted at rest and after stimulation  Comfort (Breast/Nipple):  2 = Soft / non-tender  Hold (Positioning):  1 = Assistance needed to correctly position infant at breast and maintain latch  LATCH score:  8  Attached assessment:  Deep  Lips flanged:  Yes.    Lips untucked:  Yes.    Suck assessment:  Displays both   Pre-feed weight:  3350 g  (7 lb. 6.1 oz.) Post-feed weight:  3354 g (7 lb. 6.1 oz.) Amount transferred:  4 ml Amount supplemented:  30 ml     Total amount transferred:  8 ml Total supplement given:  30 ml  Mom reports breasts are feeling slightly heavier this morning.Reports nipples are feeling better Has been nursing, pumping and bottle feeding EBM and formula. Dad very helpful. Emma latched better to left breast- without NS. She is more awake. Some swallows noted and mom reports breast feels a little softer. Encouraged to continue pumping and supplementing until good milk supply in and Kara Mead is emptying breasts. They had put 4 oz of formula in a bottle- fed her some and were going to save it for another feeding. Encouraged to use  one bottle per feeding- if milk left over to throw it away. Reports pumping is going well- no pain. State they feel breast feeding is getting easier. No further questions at present. To call prn

## 2015-08-09 LAB — HM PAP SMEAR: HM PAP: NORMAL

## 2015-12-15 ENCOUNTER — Encounter: Payer: Self-pay | Admitting: Internal Medicine

## 2015-12-15 ENCOUNTER — Ambulatory Visit (INDEPENDENT_AMBULATORY_CARE_PROVIDER_SITE_OTHER): Payer: 59 | Admitting: Internal Medicine

## 2015-12-15 VITALS — BP 118/80 | HR 86 | Temp 98.2°F | Resp 16 | Ht 66.0 in | Wt 161.0 lb

## 2015-12-15 DIAGNOSIS — H43399 Other vitreous opacities, unspecified eye: Secondary | ICD-10-CM | POA: Insufficient documentation

## 2015-12-15 DIAGNOSIS — H43393 Other vitreous opacities, bilateral: Secondary | ICD-10-CM

## 2015-12-15 DIAGNOSIS — Z0000 Encounter for general adult medical examination without abnormal findings: Secondary | ICD-10-CM

## 2015-12-15 NOTE — Progress Notes (Signed)
Pre visit review using our clinic review tool, if applicable. No additional management support is needed unless otherwise documented below in the visit note. 

## 2015-12-15 NOTE — Patient Instructions (Signed)
Preventive Care for Adults, Female A healthy lifestyle and preventive care can promote health and wellness. Preventive health guidelines for women include the following key practices.  A routine yearly physical is a good way to check with your health care provider about your health and preventive screening. It is a chance to share any concerns and updates on your health and to receive a thorough exam.  Visit your dentist for a routine exam and preventive care every 6 months. Brush your teeth twice a day and floss once a day. Good oral hygiene prevents tooth decay and gum disease.  The frequency of eye exams is based on your age, health, family medical history, use of contact lenses, and other factors. Follow your health care provider's recommendations for frequency of eye exams.  Eat a healthy diet. Foods like vegetables, fruits, whole grains, low-fat dairy products, and lean protein foods contain the nutrients you need without too many calories. Decrease your intake of foods high in solid fats, added sugars, and salt. Eat the right amount of calories for you.Get information about a proper diet from your health care provider, if necessary.  Regular physical exercise is one of the most important things you can do for your health. Most adults should get at least 150 minutes of moderate-intensity exercise (any activity that increases your heart rate and causes you to sweat) each week. In addition, most adults need muscle-strengthening exercises on 2 or more days a week.  Maintain a healthy weight. The body mass index (BMI) is a screening tool to identify possible weight problems. It provides an estimate of body fat based on height and weight. Your health care provider can find your BMI and can help you achieve or maintain a healthy weight.For adults 20 years and older:  A BMI below 18.5 is considered underweight.  A BMI of 18.5 to 24.9 is normal.  A BMI of 25 to 29.9 is considered overweight.  A  BMI of 30 and above is considered obese.  Maintain normal blood lipids and cholesterol levels by exercising and minimizing your intake of saturated fat. Eat a balanced diet with plenty of fruit and vegetables. Blood tests for lipids and cholesterol should begin at age 45 and be repeated every 5 years. If your lipid or cholesterol levels are high, you are over 50, or you are at high risk for heart disease, you may need your cholesterol levels checked more frequently.Ongoing high lipid and cholesterol levels should be treated with medicines if diet and exercise are not working.  If you smoke, find out from your health care provider how to quit. If you do not use tobacco, do not start.  Lung cancer screening is recommended for adults aged 45-80 years who are at high risk for developing lung cancer because of a history of smoking. A yearly low-dose CT scan of the lungs is recommended for people who have at least a 30-pack-year history of smoking and are a current smoker or have quit within the past 15 years. A pack year of smoking is smoking an average of 1 pack of cigarettes a day for 1 year (for example: 1 pack a day for 30 years or 2 packs a day for 15 years). Yearly screening should continue until the smoker has stopped smoking for at least 15 years. Yearly screening should be stopped for people who develop a health problem that would prevent them from having lung cancer treatment.  If you are pregnant, do not drink alcohol. If you are  breastfeeding, be very cautious about drinking alcohol. If you are not pregnant and choose to drink alcohol, do not have more than 1 drink per day. One drink is considered to be 12 ounces (355 mL) of beer, 5 ounces (148 mL) of wine, or 1.5 ounces (44 mL) of liquor.  Avoid use of street drugs. Do not share needles with anyone. Ask for help if you need support or instructions about stopping the use of drugs.  High blood pressure causes heart disease and increases the risk  of stroke. Your blood pressure should be checked at least every 1 to 2 years. Ongoing high blood pressure should be treated with medicines if weight loss and exercise do not work.  If you are 55-79 years old, ask your health care provider if you should take aspirin to prevent strokes.  Diabetes screening is done by taking a blood sample to check your blood glucose level after you have not eaten for a certain period of time (fasting). If you are not overweight and you do not have risk factors for diabetes, you should be screened once every 3 years starting at age 45. If you are overweight or obese and you are 40-70 years of age, you should be screened for diabetes every year as part of your cardiovascular risk assessment.  Breast cancer screening is essential preventive care for women. You should practice "breast self-awareness." This means understanding the normal appearance and feel of your breasts and may include breast self-examination. Any changes detected, no matter how small, should be reported to a health care provider. Women in their 20s and 30s should have a clinical breast exam (CBE) by a health care provider as part of a regular health exam every 1 to 3 years. After age 40, women should have a CBE every year. Starting at age 40, women should consider having a mammogram (breast X-ray test) every year. Women who have a family history of breast cancer should talk to their health care provider about genetic screening. Women at a high risk of breast cancer should talk to their health care providers about having an MRI and a mammogram every year.  Breast cancer gene (BRCA)-related cancer risk assessment is recommended for women who have family members with BRCA-related cancers. BRCA-related cancers include breast, ovarian, tubal, and peritoneal cancers. Having family members with these cancers may be associated with an increased risk for harmful changes (mutations) in the breast cancer genes BRCA1 and  BRCA2. Results of the assessment will determine the need for genetic counseling and BRCA1 and BRCA2 testing.  Your health care provider may recommend that you be screened regularly for cancer of the pelvic organs (ovaries, uterus, and vagina). This screening involves a pelvic examination, including checking for microscopic changes to the surface of your cervix (Pap test). You may be encouraged to have this screening done every 3 years, beginning at age 21.  For women ages 30-65, health care providers may recommend pelvic exams and Pap testing every 3 years, or they may recommend the Pap and pelvic exam, combined with testing for human papilloma virus (HPV), every 5 years. Some types of HPV increase your risk of cervical cancer. Testing for HPV may also be done on women of any age with unclear Pap test results.  Other health care providers may not recommend any screening for nonpregnant women who are considered low risk for pelvic cancer and who do not have symptoms. Ask your health care provider if a screening pelvic exam is right for   you.  If you have had past treatment for cervical cancer or a condition that could lead to cancer, you need Pap tests and screening for cancer for at least 20 years after your treatment. If Pap tests have been discontinued, your risk factors (such as having a new sexual partner) need to be reassessed to determine if screening should resume. Some women have medical problems that increase the chance of getting cervical cancer. In these cases, your health care provider may recommend more frequent screening and Pap tests.  Colorectal cancer can be detected and often prevented. Most routine colorectal cancer screening begins at the age of 50 years and continues through age 75 years. However, your health care provider may recommend screening at an earlier age if you have risk factors for colon cancer. On a yearly basis, your health care provider may provide home test kits to check  for hidden blood in the stool. Use of a small camera at the end of a tube, to directly examine the colon (sigmoidoscopy or colonoscopy), can detect the earliest forms of colorectal cancer. Talk to your health care provider about this at age 50, when routine screening begins. Direct exam of the colon should be repeated every 5-10 years through age 75 years, unless early forms of precancerous polyps or small growths are found.  People who are at an increased risk for hepatitis B should be screened for this virus. You are considered at high risk for hepatitis B if:  You were born in a country where hepatitis B occurs often. Talk with your health care provider about which countries are considered high risk.  Your parents were born in a high-risk country and you have not received a shot to protect against hepatitis B (hepatitis B vaccine).  You have HIV or AIDS.  You use needles to inject street drugs.  You live with, or have sex with, someone who has hepatitis B.  You get hemodialysis treatment.  You take certain medicines for conditions like cancer, organ transplantation, and autoimmune conditions.  Hepatitis C blood testing is recommended for all people born from 1945 through 1965 and any individual with known risks for hepatitis C.  Practice safe sex. Use condoms and avoid high-risk sexual practices to reduce the spread of sexually transmitted infections (STIs). STIs include gonorrhea, chlamydia, syphilis, trichomonas, herpes, HPV, and human immunodeficiency virus (HIV). Herpes, HIV, and HPV are viral illnesses that have no cure. They can result in disability, cancer, and death.  You should be screened for sexually transmitted illnesses (STIs) including gonorrhea and chlamydia if:  You are sexually active and are younger than 24 years.  You are older than 24 years and your health care provider tells you that you are at risk for this type of infection.  Your sexual activity has changed  since you were last screened and you are at an increased risk for chlamydia or gonorrhea. Ask your health care provider if you are at risk.  If you are at risk of being infected with HIV, it is recommended that you take a prescription medicine daily to prevent HIV infection. This is called preexposure prophylaxis (PrEP). You are considered at risk if:  You are sexually active and do not regularly use condoms or know the HIV status of your partner(s).  You take drugs by injection.  You are sexually active with a partner who has HIV.  Talk with your health care provider about whether you are at high risk of being infected with HIV. If   you choose to begin PrEP, you should first be tested for HIV. You should then be tested every 3 months for as long as you are taking PrEP.  Osteoporosis is a disease in which the bones lose minerals and strength with aging. This can result in serious bone fractures or breaks. The risk of osteoporosis can be identified using a bone density scan. Women ages 67 years and over and women at risk for fractures or osteoporosis should discuss screening with their health care providers. Ask your health care provider whether you should take a calcium supplement or vitamin D to reduce the rate of osteoporosis.  Menopause can be associated with physical symptoms and risks. Hormone replacement therapy is available to decrease symptoms and risks. You should talk to your health care provider about whether hormone replacement therapy is right for you.  Use sunscreen. Apply sunscreen liberally and repeatedly throughout the day. You should seek shade when your shadow is shorter than you. Protect yourself by wearing long sleeves, pants, a wide-brimmed hat, and sunglasses year round, whenever you are outdoors.  Once a month, do a whole body skin exam, using a mirror to look at the skin on your back. Tell your health care provider of new moles, moles that have irregular borders, moles that  are larger than a pencil eraser, or moles that have changed in shape or color.  Stay current with required vaccines (immunizations).  Influenza vaccine. All adults should be immunized every year.  Tetanus, diphtheria, and acellular pertussis (Td, Tdap) vaccine. Pregnant women should receive 1 dose of Tdap vaccine during each pregnancy. The dose should be obtained regardless of the length of time since the last dose. Immunization is preferred during the 27th-36th week of gestation. An adult who has not previously received Tdap or who does not know her vaccine status should receive 1 dose of Tdap. This initial dose should be followed by tetanus and diphtheria toxoids (Td) booster doses every 10 years. Adults with an unknown or incomplete history of completing a 3-dose immunization series with Td-containing vaccines should begin or complete a primary immunization series including a Tdap dose. Adults should receive a Td booster every 10 years.  Varicella vaccine. An adult without evidence of immunity to varicella should receive 2 doses or a second dose if she has previously received 1 dose. Pregnant females who do not have evidence of immunity should receive the first dose after pregnancy. This first dose should be obtained before leaving the health care facility. The second dose should be obtained 4-8 weeks after the first dose.  Human papillomavirus (HPV) vaccine. Females aged 13-26 years who have not received the vaccine previously should obtain the 3-dose series. The vaccine is not recommended for use in pregnant females. However, pregnancy testing is not needed before receiving a dose. If a female is found to be pregnant after receiving a dose, no treatment is needed. In that case, the remaining doses should be delayed until after the pregnancy. Immunization is recommended for any person with an immunocompromised condition through the age of 61 years if she did not get any or all doses earlier. During the  3-dose series, the second dose should be obtained 4-8 weeks after the first dose. The third dose should be obtained 24 weeks after the first dose and 16 weeks after the second dose.  Zoster vaccine. One dose is recommended for adults aged 30 years or older unless certain conditions are present.  Measles, mumps, and rubella (MMR) vaccine. Adults born  before 1957 generally are considered immune to measles and mumps. Adults born in 1957 or later should have 1 or more doses of MMR vaccine unless there is a contraindication to the vaccine or there is laboratory evidence of immunity to each of the three diseases. A routine second dose of MMR vaccine should be obtained at least 28 days after the first dose for students attending postsecondary schools, health care workers, or international travelers. People who received inactivated measles vaccine or an unknown type of measles vaccine during 1963-1967 should receive 2 doses of MMR vaccine. People who received inactivated mumps vaccine or an unknown type of mumps vaccine before 1979 and are at high risk for mumps infection should consider immunization with 2 doses of MMR vaccine. For females of childbearing age, rubella immunity should be determined. If there is no evidence of immunity, females who are not pregnant should be vaccinated. If there is no evidence of immunity, females who are pregnant should delay immunization until after pregnancy. Unvaccinated health care workers born before 1957 who lack laboratory evidence of measles, mumps, or rubella immunity or laboratory confirmation of disease should consider measles and mumps immunization with 2 doses of MMR vaccine or rubella immunization with 1 dose of MMR vaccine.  Pneumococcal 13-valent conjugate (PCV13) vaccine. When indicated, a person who is uncertain of his immunization history and has no record of immunization should receive the PCV13 vaccine. All adults 65 years of age and older should receive this  vaccine. An adult aged 19 years or older who has certain medical conditions and has not been previously immunized should receive 1 dose of PCV13 vaccine. This PCV13 should be followed with a dose of pneumococcal polysaccharide (PPSV23) vaccine. Adults who are at high risk for pneumococcal disease should obtain the PPSV23 vaccine at least 8 weeks after the dose of PCV13 vaccine. Adults older than 34 years of age who have normal immune system function should obtain the PPSV23 vaccine dose at least 1 year after the dose of PCV13 vaccine.  Pneumococcal polysaccharide (PPSV23) vaccine. When PCV13 is also indicated, PCV13 should be obtained first. All adults aged 65 years and older should be immunized. An adult younger than age 65 years who has certain medical conditions should be immunized. Any person who resides in a nursing home or long-term care facility should be immunized. An adult smoker should be immunized. People with an immunocompromised condition and certain other conditions should receive both PCV13 and PPSV23 vaccines. People with human immunodeficiency virus (HIV) infection should be immunized as soon as possible after diagnosis. Immunization during chemotherapy or radiation therapy should be avoided. Routine use of PPSV23 vaccine is not recommended for American Indians, [place] Natives, or people younger than 65 years unless there are medical conditions that require PPSV23 vaccine. When indicated, people who have unknown immunization and have no record of immunization should receive PPSV23 vaccine. One-time revaccination 5 years after the first dose of PPSV23 is recommended for people aged 19-64 years who have chronic kidney failure, nephrotic syndrome, asplenia, or immunocompromised conditions. People who received 1-2 doses of PPSV23 before age 65 years should receive another dose of PPSV23 vaccine at age 65 years or later if at least 5 years have passed since the previous dose. Doses of PPSV23 are not  needed for people immunized with PPSV23 at or after age 65 years.  Meningococcal vaccine. Adults with asplenia or persistent complement component deficiencies should receive 2 doses of quadrivalent meningococcal conjugate (MenACWY-D) vaccine. The doses should be obtained   at least 2 months apart. Microbiologists working with certain meningococcal bacteria, [place] recruits, people at risk during an outbreak, and people who travel to or live in countries with a high rate of meningitis should be immunized. A first-year college student up through age 34 years who is living in a residence hall should receive a dose if she did not receive a dose on or after her 16th birthday. Adults who have certain high-risk conditions should receive one or more doses of vaccine.  Hepatitis A vaccine. Adults who wish to be protected from this disease, have certain high-risk conditions, work with hepatitis A-infected animals, work in hepatitis A research labs, or travel to or work in countries with a high rate of hepatitis A should be immunized. Adults who were previously unvaccinated and who anticipate close contact with an international adoptee during the first 60 days after arrival in the [place] from a country with a high rate of hepatitis A should be immunized.  Hepatitis B vaccine. Adults who wish to be protected from this disease, have certain high-risk conditions, may be exposed to blood or other infectious body fluids, are household contacts or sex partners of hepatitis B positive people, are clients or workers in certain care facilities, or travel to or work in countries with a high rate of hepatitis B should be immunized.  Haemophilus influenzae type b (Hib) vaccine. A previously unvaccinated person with asplenia or sickle cell disease or having a scheduled splenectomy should receive 1 dose of Hib vaccine. Regardless of previous immunization, a recipient of a hematopoietic stem cell transplant should receive a  3-dose series 6-12 months after her successful transplant. Hib vaccine is not recommended for adults with HIV infection. Preventive Services / Frequency Ages 35 to 4 years  Blood pressure check.** / Every 3-5 years.  Lipid and cholesterol check.** / Every 5 years beginning at age 60.  Clinical breast exam.** / Every 3 years for women in their 71s and 10s.  BRCA-related cancer risk assessment.** / For women who have family members with a BRCA-related cancer (breast, ovarian, tubal, or peritoneal cancers).  Pap test.** / Every 2 years from ages 76 through 26. Every 3 years starting at age 61 through age 76 or 93 with a history of 3 consecutive normal Pap tests.  HPV screening.** / Every 3 years from ages 37 through ages 60 to 51 with a history of 3 consecutive normal Pap tests.  Hepatitis C blood test.** / For any individual with known risks for hepatitis C.  Skin self-exam. / Monthly.  Influenza vaccine. / Every year.  Tetanus, diphtheria, and acellular pertussis (Tdap, Td) vaccine.** / Consult your health care provider. Pregnant women should receive 1 dose of Tdap vaccine during each pregnancy. 1 dose of Td every 10 years.  Varicella vaccine.** / Consult your health care provider. Pregnant females who do not have evidence of immunity should receive the first dose after pregnancy.  HPV vaccine. / 3 doses over 6 months, if 93 and younger. The vaccine is not recommended for use in pregnant females. However, pregnancy testing is not needed before receiving a dose.  Measles, mumps, rubella (MMR) vaccine.** / You need at least 1 dose of MMR if you were born in 1957 or later. You may also need a 2nd dose. For females of childbearing age, rubella immunity should be determined. If there is no evidence of immunity, females who are not pregnant should be vaccinated. If there is no evidence of immunity, females who are  pregnant should delay immunization until after pregnancy.  Pneumococcal  13-valent conjugate (PCV13) vaccine.** / Consult your health care provider.  Pneumococcal polysaccharide (PPSV23) vaccine.** / 1 to 2 doses if you smoke cigarettes or if you have certain conditions.  Meningococcal vaccine.** / 1 dose if you are age 68 to 8 years and a Market researcher living in a residence hall, or have one of several medical conditions, you need to get vaccinated against meningococcal disease. You may also need additional booster doses.  Hepatitis A vaccine.** / Consult your health care provider.  Hepatitis B vaccine.** / Consult your health care provider.  Haemophilus influenzae type b (Hib) vaccine.** / Consult your health care provider. Ages 7 to 53 years  Blood pressure check.** / Every year.  Lipid and cholesterol check.** / Every 5 years beginning at age 25 years.  Lung cancer screening. / Every year if you are aged 11-80 years and have a 30-pack-year history of smoking and currently smoke or have quit within the past 15 years. Yearly screening is stopped once you have quit smoking for at least 15 years or develop a health problem that would prevent you from having lung cancer treatment.  Clinical breast exam.** / Every year after age 48 years.  BRCA-related cancer risk assessment.** / For women who have family members with a BRCA-related cancer (breast, ovarian, tubal, or peritoneal cancers).  Mammogram.** / Every year beginning at age 41 years and continuing for as long as you are in good health. Consult with your health care provider.  Pap test.** / Every 3 years starting at age 65 years through age 37 or 70 years with a history of 3 consecutive normal Pap tests.  HPV screening.** / Every 3 years from ages 72 years through ages 60 to 40 years with a history of 3 consecutive normal Pap tests.  Fecal occult blood test (FOBT) of stool. / Every year beginning at age 21 years and continuing until age 5 years. You may not need to do this test if you get  a colonoscopy every 10 years.  Flexible sigmoidoscopy or colonoscopy.** / Every 5 years for a flexible sigmoidoscopy or every 10 years for a colonoscopy beginning at age 35 years and continuing until age 48 years.  Hepatitis C blood test.** / For all people born from [birth year] through 1965 and any individual with known risks for hepatitis C.  Skin self-exam. / Monthly.  Influenza vaccine. / Every year.  Tetanus, diphtheria, and acellular pertussis (Tdap/Td) vaccine.** / Consult your health care provider. Pregnant women should receive 1 dose of Tdap vaccine during each pregnancy. 1 dose of Td every 10 years.  Varicella vaccine.** / Consult your health care provider. Pregnant females who do not have evidence of immunity should receive the first dose after pregnancy.  Zoster vaccine.** / 1 dose for adults aged 30 years or older.  Measles, mumps, rubella (MMR) vaccine.** / You need at least 1 dose of MMR if you were born in 1957 or later. You may also need a second dose. For females of childbearing age, rubella immunity should be determined. If there is no evidence of immunity, females who are not pregnant should be vaccinated. If there is no evidence of immunity, females who are pregnant should delay immunization until after pregnancy.  Pneumococcal 13-valent conjugate (PCV13) vaccine.** / Consult your health care provider.  Pneumococcal polysaccharide (PPSV23) vaccine.** / 1 to 2 doses if you smoke cigarettes or if you have certain conditions.  Meningococcal vaccine.** /  Consult your health care provider.  Hepatitis A vaccine.** / Consult your health care provider.  Hepatitis B vaccine.** / Consult your health care provider.  Haemophilus influenzae type b (Hib) vaccine.** / Consult your health care provider. Ages 64 years and over  Blood pressure check.** / Every year.  Lipid and cholesterol check.** / Every 5 years beginning at age 23 years.  Lung cancer screening. / Every year if you  are aged 16-80 years and have a 30-pack-year history of smoking and currently smoke or have quit within the past 15 years. Yearly screening is stopped once you have quit smoking for at least 15 years or develop a health problem that would prevent you from having lung cancer treatment.  Clinical breast exam.** / Every year after age 74 years.  BRCA-related cancer risk assessment.** / For women who have family members with a BRCA-related cancer (breast, ovarian, tubal, or peritoneal cancers).  Mammogram.** / Every year beginning at age 44 years and continuing for as long as you are in good health. Consult with your health care provider.  Pap test.** / Every 3 years starting at age 58 years through age 22 or 39 years with 3 consecutive normal Pap tests. Testing can be stopped between 65 and 70 years with 3 consecutive normal Pap tests and no abnormal Pap or HPV tests in the past 10 years.  HPV screening.** / Every 3 years from ages 64 years through ages 70 or 61 years with a history of 3 consecutive normal Pap tests. Testing can be stopped between 65 and 70 years with 3 consecutive normal Pap tests and no abnormal Pap or HPV tests in the past 10 years.  Fecal occult blood test (FOBT) of stool. / Every year beginning at age 40 years and continuing until age 27 years. You may not need to do this test if you get a colonoscopy every 10 years.  Flexible sigmoidoscopy or colonoscopy.** / Every 5 years for a flexible sigmoidoscopy or every 10 years for a colonoscopy beginning at age 7 years and continuing until age 32 years.  Hepatitis C blood test.** / For all people born from [birth year] through 1965 and any individual with known risks for hepatitis C.  Osteoporosis screening.** / A one-time screening for women ages 30 years and over and women at risk for fractures or osteoporosis.  Skin self-exam. / Monthly.  Influenza vaccine. / Every year.  Tetanus, diphtheria, and acellular pertussis (Tdap/Td)  vaccine.** / 1 dose of Td every 10 years.  Varicella vaccine.** / Consult your health care provider.  Zoster vaccine.** / 1 dose for adults aged 35 years or older.  Pneumococcal 13-valent conjugate (PCV13) vaccine.** / Consult your health care provider.  Pneumococcal polysaccharide (PPSV23) vaccine.** / 1 dose for all adults aged 46 years and older.  Meningococcal vaccine.** / Consult your health care provider.  Hepatitis A vaccine.** / Consult your health care provider.  Hepatitis B vaccine.** / Consult your health care provider.  Haemophilus influenzae type b (Hib) vaccine.** / Consult your health care provider. ** Family history and personal history of risk and conditions may change your health care provider's recommendations.   This information is not intended to replace advice given to you by your health care provider. Make sure you discuss any questions you have with your health care provider.   Document Released: 02/06/2002 Document Revised: 01/01/2015 Document Reviewed: 05/08/2011 Elsevier Interactive Patient Education [employer].

## 2015-12-16 NOTE — Progress Notes (Signed)
Subjective:  Patient ID: Caitlin Christensen, female    DOB: 12-20-1981  Age: 34 y.o. MRN: [id number]  CC: Annual Exam   HPI Caitlin Christensen presents for for a physical but she also complains of about an 8 month history of floaters. She states that about 2-3 times a day she sees black spots float over the her eyes starting at the top and working the way down. The floaters are worsened by standing on a computer screen all day. She is not consulted an eye doctor about this.  Outpatient Prescriptions Prior to Visit  Medication Sig Dispense Refill  . pantoprazole (PROTONIX) 40 MG tablet Take 40 mg by mouth daily.    Marland Kitchen acetaminophen (TYLENOL) 500 MG tablet Take 500 mg by mouth every 6 (six) hours as needed for mild pain, moderate pain or headache.    . ibuprofen (ADVIL,MOTRIN) 600 MG tablet Take 1 tablet (600 mg total) by mouth every 6 (six) hours. 60 tablet 0  . oxyCODONE-acetaminophen (PERCOCET/ROXICET) 5-325 MG per tablet Take 1 tablet by mouth every 4 (four) hours as needed (for pain scale 4-7). 30 tablet 0  . Prenatal Vit-Fe Fumarate-FA (PRENATAL MULTIVITAMIN) TABS tablet Take 1 tablet by mouth daily at 12 noon.    Marland Kitchen zolpidem (AMBIEN) 5 MG tablet Take 5 mg by mouth at bedtime as needed for sleep.     No facility-administered medications prior to visit.    ROS Review of Systems  Constitutional: Negative.   HENT: Negative.   Eyes: Positive for visual disturbance. Negative for photophobia, pain, discharge, redness and itching.  Respiratory: Negative.   Cardiovascular: Negative.   Gastrointestinal: Negative.   Endocrine: Negative.   Genitourinary: Negative.   Musculoskeletal: Negative.   Skin: Negative.   Allergic/Immunologic: Negative.   Neurological: Negative.   Hematological: Negative.   Psychiatric/Behavioral: Negative.     Objective:  BP 118/80 mmHg  Pulse 86  Temp(Src) 98.2 F (36.8 C) (Oral)  Resp 16  Ht  (1.676 m)  Wt 161 lb (73.029 kg)  BMI 26.00 kg/m2   SpO2 98%  BP Readings from Last 3 Encounters:  12/15/15 118/80  06/27/15 114/68  06/23/15 122/81    Wt Readings from Last 3 Encounters:  12/15/15 161 lb (73.029 kg)  06/23/15 198 lb (89.812 kg)  06/10/15 196 lb 6.4 oz (89.086 kg)    Physical Exam  Constitutional: She is oriented to person, place, and time. No distress.  HENT:  Head: Normocephalic and atraumatic.  Mouth/Throat: Oropharynx is clear and moist. No oropharyngeal exudate.  Eyes: Conjunctivae and EOM are normal. Pupils are equal, round, and reactive to light. Right eye exhibits no chemosis, no discharge, no exudate and no hordeolum. No foreign body present in the right eye. Left eye exhibits no chemosis, no discharge, no exudate and no hordeolum. No foreign body present in the left eye. Right conjunctiva is not injected. Right conjunctiva has no hemorrhage. Left conjunctiva is not injected. Left conjunctiva has no hemorrhage. No scleral icterus. Right eye exhibits normal extraocular motion and no nystagmus. Left eye exhibits normal extraocular motion and no nystagmus. Right pupil is round and reactive. Left pupil is round and reactive. Pupils are equal.  Neck: Normal range of motion. Neck supple. No JVD present. No tracheal deviation present. No thyromegaly present.  Cardiovascular: Normal rate, regular rhythm, normal heart sounds and intact distal pulses.  Exam reveals no gallop and no friction rub.   No murmur heard. Pulmonary/Chest: Effort normal and breath sounds normal. No  stridor. No respiratory distress. She has no wheezes. She has no rales. She exhibits no tenderness.  Abdominal: Soft. Bowel sounds are normal. She exhibits no distension and no mass. There is no tenderness. There is no rebound and no guarding.  Musculoskeletal: Normal range of motion. She exhibits no edema or tenderness.  Lymphadenopathy:    She has no cervical adenopathy.  Neurological: She is oriented to person, place, and time.  Skin: Skin is warm and  dry. No rash noted. She is not diaphoretic. No erythema. No pallor.  Psychiatric: She has a normal mood and affect. Her behavior is normal. Judgment and thought content normal.    Lab Results  Component Value Date   WBC 10.2 06/26/2015   HGB 9.4* 06/26/2015   HCT 28.4* 06/26/2015   PLT 225 06/26/2015   GLUCOSE 85 01/01/2013   CHOL 164 01/01/2013   TRIG 75.0 01/01/2013   HDL 34.70* 01/01/2013   LDLCALC 114* 01/01/2013   ALT 21 01/01/2013   AST 17 01/01/2013   NA 139 01/01/2013   K 4.2 01/01/2013   CL 110 01/01/2013   CREATININE 0.7 01/01/2013   BUN 8 01/01/2013   CO2 25 01/01/2013   TSH 1.14 01/01/2013    No results found.  Assessment & Plan:   Trula OreChristina was seen today for annual exam.  Diagnoses and all orders for this visit:  Routine general medical examination at a health care facility- exam done, labs ordered, exceeds reviewed and updated, patient education material was given. -     Lipid panel; Future -     Comprehensive metabolic panel; Future -     CBC with Differential/Platelet; Future -     TSH; Future  Floaters, bilateral -     Ambulatory referral to Ophthalmology   I have discontinued [name]'s prenatal multivitamin, acetaminophen, zolpidem, ibuprofen, and oxyCODONE-acetaminophen. I am also having her maintain her pantoprazole, LARIN 1/20, and sertraline.  Meds ordered this encounter  Medications  . LARIN 1/20 1-20 MG-MCG tablet    Sig:     Refill:  4  . sertraline (ZOLOFT) 50 MG tablet    Sig:     Refill:  2     Follow-up: Return if symptoms worsen or fail to improve.  Sanda Lingerhomas Zavien Clubb, MD

## 2016-01-06 DIAGNOSIS — H04123 Dry eye syndrome of bilateral lacrimal glands: Secondary | ICD-10-CM | POA: Diagnosis not present

## 2016-01-06 DIAGNOSIS — H5213 Myopia, bilateral: Secondary | ICD-10-CM | POA: Diagnosis not present

## 2016-01-06 DIAGNOSIS — H43393 Other vitreous opacities, bilateral: Secondary | ICD-10-CM | POA: Diagnosis not present

## 2016-01-31 ENCOUNTER — Encounter: Payer: Self-pay | Admitting: Internal Medicine

## 2016-02-17 MED FILL — NORETHIND-ETH ESTRAD 1-0.02: 1-20 | 84 days supply | Qty: 84 | Fill #2

## 2016-03-16 ENCOUNTER — Ambulatory Visit (INDEPENDENT_AMBULATORY_CARE_PROVIDER_SITE_OTHER): Payer: 59 | Admitting: Family Medicine

## 2016-03-16 ENCOUNTER — Encounter: Payer: Self-pay | Admitting: Family Medicine

## 2016-03-16 VITALS — BP 100/60 | HR 124 | Temp 98.4°F | Resp 20 | Ht 66.0 in | Wt 147.0 lb

## 2016-03-16 DIAGNOSIS — R52 Pain, unspecified: Secondary | ICD-10-CM

## 2016-03-16 DIAGNOSIS — J029 Acute pharyngitis, unspecified: Secondary | ICD-10-CM | POA: Diagnosis not present

## 2016-03-16 DIAGNOSIS — R05 Cough: Secondary | ICD-10-CM | POA: Diagnosis not present

## 2016-03-16 DIAGNOSIS — R059 Cough, unspecified: Secondary | ICD-10-CM

## 2016-03-16 LAB — POCT RAPID STREP A (OFFICE): Rapid Strep A Screen: NEGATIVE

## 2016-03-16 LAB — POCT INFLUENZA A: Rapid Influenza A Ag: NEGATIVE

## 2016-03-16 MED ORDER — GUAIFENESIN-CODEINE 100-10 MG/5ML PO SOLN: 5.0000 mL | Freq: Four times a day (QID) | ORAL | Status: DC | PRN

## 2016-03-16 NOTE — Patient Instructions (Signed)
Viral illness (sore throat, ear aches, congestion, productive cough, body aches)  For body aches and sore throat- would schedule tylenol or ibuprofen every 6 hours  For congestion- would use mucinex-DM in the daytime as also helps with cough  In the evening- use codeine cough syrup which also has mucinex  If fever, shortness of breath, symptoms especially the sinus pain last past 10 days, double sickening please return to care  Results for orders placed or performed in visit on 03/16/16 (from the past 24 hour(s))  POCT rapid strep A     Status: None   Collection Time: 03/16/16  4:59 PM  Result Value Ref Range   Rapid Strep A Screen Negative Negative  POCT Influenza A     Status: None   Collection Time: 03/16/16  4:59 PM  Result Value Ref Range   Rapid Influenza A Ag negative

## 2016-03-16 NOTE — Progress Notes (Signed)
PCP: Sanda Lingerhomas Jones, MD  Subjective:  Caitlin Christensen is a 35 y.o. year old very pleasant female patient who presents with flu like symptoms including body aches, cough, congestion with green sputum. She also has a rather severe sore throat -other symptoms: fatigue. Has not had fevers but was flu positive a few years ago and flu test was negative.  -started: suddenly yesterday -inside 48 hour treatment window if needed for tamiflu: yes -high risk condition (children <5, adults >65, chronic pulmonary or cardiac condition, immunosuppression, pregnancy, nursing home resident, morbid obesity) : no  -symptoms are worsening -previous treatments: acetaminophen, ibuprofen for sore throat - endorses sick contact; specifically influenza: unknown. Works as Charity fundraiserN in labor in delivery. Daughter has cold and ear infection.   ROS-denies SOB, NVD, no dental pain. Does have some sinus pain  Pertinent Past Medical History-  Previously healthy nonsmoker  Medications- reviewed  Current Outpatient Prescriptions  Medication Sig Dispense Refill  . LARIN 1/20 1-20 MG-MCG tablet   4   Objective: BP 100/60 mmHg  Pulse 124  Temp(Src) 98.4 F (36.9 C) (Oral)  Resp 20  Ht 5\' 6"  (1.676 m)  Wt 147 lb (66.679 kg)  BMI 23.74 kg/m2  SpO2 99%  LMP 02/14/2016 (Exact Date)  Breastfeeding? No Gen: NAD, appears fatigued HEENT: Turbinates erythematous, TM normal, pharynx mildly erythematous with no tonsilar exudate or edema, no sinus tenderness CV: RRR no murmurs rubs or gallops Lungs: CTAB no crackles, wheeze, rhonchi Abdomen: soft/nontender/nondistended/normal bowel sounds. Ext: no edema Skin: warm, dry, no rash  Results for orders placed or performed in visit on 03/16/16 (from the past 24 hour(s))  POCT rapid strep A     Status: None   Collection Time: 03/16/16  4:59 PM  Result Value Ref Range   Rapid Strep A Screen Negative Negative  POCT Influenza A     Status: None   Collection Time: 03/16/16  4:59 PM   Result Value Ref Range   Rapid Influenza A Ag negative     Assessment/Plan:  Flu-like illness History and exam today are suggestive of viral process. Patients influenza test was negative.  Pretest probability of influenza was moderate. Centor criteria was 0- she requested strep test which was negative  Patient will not be treated with Tamiflu. Symptomatic treatment with: meds below and see instructions per AVS  Finally, we reviewed reasons to return to care including if symptoms worsen or persist or new concerns arise. Also see AVS  Meds ordered this encounter  Medications  . guaiFENesin-codeine 100-10 MG/5ML syrup    Sig: Take 5 mLs by mouth every 6 (six) hours as needed for cough.    Dispense:  120 mL    Refill:  0

## 2016-03-21 ENCOUNTER — Telehealth: Payer: Self-pay | Admitting: Internal Medicine

## 2016-03-21 NOTE — Telephone Encounter (Addendum)
Pt states she has to work wed and thurs, Made appointment for Friday. Pt states she is having the same symptoms, was hoping to get an abx or tessalon pearls called into  [facility]   Will call and cancel if she is better.

## 2016-03-21 NOTE — Telephone Encounter (Signed)
Pt saw Dr Durene CalHunter on 4/23. Pt states she was to let dr hunter know this week if she was not any better. Pt states she is not better, still congested, at night the cough is worse, and productive cough in the am.  Advised pt she would need to contact her primary care, Dr Yetta BarreJones.   Pt states she lives closer to here and prefers to see Dr Durene CalHunter. Advised pt I would need to ask if ok with dr hunter to transfer.  [facility]

## 2016-03-21 NOTE — Telephone Encounter (Signed)
See below

## 2016-03-21 NOTE — Telephone Encounter (Signed)
Im fine with transfer if ok with Dr. Yetta BarreJones. IF she wants to follow up with me tomorrow I have 3 either acute or SDAs- can use any of those

## 2016-03-24 ENCOUNTER — Ambulatory Visit: Payer: 59 | Admitting: Family Medicine

## 2016-03-24 ENCOUNTER — Encounter: Payer: Self-pay | Admitting: Family Medicine

## 2016-03-24 ENCOUNTER — Ambulatory Visit (INDEPENDENT_AMBULATORY_CARE_PROVIDER_SITE_OTHER): Payer: 59 | Admitting: Family Medicine

## 2016-03-24 VITALS — BP 102/70 | HR 87 | Temp 98.3°F | Wt 159.0 lb

## 2016-03-24 DIAGNOSIS — A499 Bacterial infection, unspecified: Secondary | ICD-10-CM | POA: Diagnosis not present

## 2016-03-24 DIAGNOSIS — B9689 Other specified bacterial agents as the cause of diseases classified elsewhere: Secondary | ICD-10-CM

## 2016-03-24 DIAGNOSIS — J329 Chronic sinusitis, unspecified: Secondary | ICD-10-CM

## 2016-03-24 MED ORDER — AMOXICILLIN-POT CLAVULANATE 875-125 MG PO TABS: 1.0000 | ORAL_TABLET | Freq: Two times a day (BID) | ORAL | Status: DC

## 2016-03-24 MED ORDER — BENZONATATE 100 MG PO CAPS: 100.0000 mg | ORAL_CAPSULE | Freq: Two times a day (BID) | ORAL | Status: DC | PRN

## 2016-03-24 NOTE — Patient Instructions (Addendum)
Prior infection has evolved into bacterial sinus infection  Treat with augmentin for 7 days  If new or worsening symptoms please return to care or if lasts past this 7 days of treatment  Tessalon as needed for cough  Sign release of information at the check out desk for your last pap smear

## 2016-03-24 NOTE — Progress Notes (Signed)
Tana ConchStephen Hunter, MD  Subjective:  Caitlin Christensen is a 35 y.o. year old very pleasant female patient who presents to establish care as well as for follow up.  See problem oriented charting ROS- see ROS below  Past Medical History-  Patient Active Problem List   Diagnosis Date Noted  . Floaters 12/15/2015    Priority: Low    Medications- reviewed and updated Current Outpatient Prescriptions  Medication Sig Dispense Refill  . guaiFENesin-codeine 100-10 MG/5ML syrup Take 5 mLs by mouth every 6 (six) hours as needed for cough. 120 mL 0  . LARIN 1/20 1-20 MG-MCG tablet   4   No current facility-administered medications for this visit.    Objective: BP 102/70 mmHg  Pulse 87  Temp(Src) 98.3 F (36.8 C)  Wt 159 lb (72.122 kg)  SpO2 97%  LMP 02/14/2016 (Exact Date) Gen: NAD, appears fatigued HEENT: Turbinates erythematous with grene drainage, TM normal with some scarring on left lower ear not documented previously, pharynx no longer erythematous and no tonsilar exudate or edema, maxillary sinus tenderness noted CV: RRR no murmurs rubs or gallops Lungs: CTAB no crackles, wheeze, rhonchi Abdomen: soft/nontender/nondistended/normal bowel sounds. Ext: no edema Skin: warm, dry, no rash  Assessment/Plan:  Bacterial Sinusitis S: Patient was seen on 03/16/16 after 1 day of flu like symptoms including body aches, cough, congestion with green sputum, rather severe sore throat. Flu swab was negative at that time. 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   This is now day 10 of illness. Since last visit has continued to have sinus pressure and is now coughing up green sputum and blowing out green sputum on regular basis.  ROS- no fever, chills, shortness of breath, nausea, vomiting  Periods every 3 months on birth control  A/P: Prior viral illness now with bacterial superinfection as evidenced by day 10 of symptoms. Treat with  augmentin for 7 days. Opts out of prednisone as usually clears with antibiotics alone. Tessalon prn for cough.   Return precautions advised.   Meds ordered this encounter  Medications  . amoxicillin-clavulanate (AUGMENTIN) 875-125 MG tablet    Sig: Take 1 tablet by mouth 2 (two) times daily.    Dispense:  14 tablet    Refill:  0  . benzonatate (TESSALON) 100 MG capsule    Sig: Take 1 capsule (100 mg total) by mouth 2 (two) times daily as needed for cough.    Dispense:  30 capsule    Refill:  0

## 2016-04-17 ENCOUNTER — Encounter: Payer: Self-pay | Admitting: Family Medicine

## 2016-05-11 MED FILL — NORETHIND-ETH ESTRAD 1-0.02: 1-20 | 84 days supply | Qty: 84 | Fill #3

## 2016-07-27 MED FILL — NORETHIND-ETH ESTRAD 1-0.02: 1-20 | 84 days supply | Qty: 84 | Fill #4

## 2016-08-31 DIAGNOSIS — Z6824 Body mass index (BMI) 24.0-24.9, adult: Secondary | ICD-10-CM | POA: Diagnosis not present

## 2016-08-31 DIAGNOSIS — Z3200 Encounter for pregnancy test, result unknown: Secondary | ICD-10-CM | POA: Diagnosis not present

## 2016-08-31 DIAGNOSIS — Z01419 Encounter for gynecological examination (general) (routine) without abnormal findings: Secondary | ICD-10-CM | POA: Diagnosis not present

## 2016-11-09 MED FILL — NORETHIND-ETH ESTRAD 1-0.02: 1-20 | 84 days supply | Qty: 84 | Fill #0

## 2016-12-01 ENCOUNTER — Encounter: Payer: Self-pay | Admitting: Family Medicine

## 2016-12-01 ENCOUNTER — Ambulatory Visit (INDEPENDENT_AMBULATORY_CARE_PROVIDER_SITE_OTHER): Payer: 59 | Admitting: Family Medicine

## 2016-12-01 DIAGNOSIS — F419 Anxiety disorder, unspecified: Secondary | ICD-10-CM | POA: Diagnosis not present

## 2016-12-01 MED ORDER — ESCITALOPRAM OXALATE 10 MG PO TABS: 10.0000 mg | ORAL_TABLET | Freq: Every day | ORAL | 3 refills | Status: DC

## 2016-12-01 MED ORDER — LORAZEPAM 0.5 MG PO TABS: 0.2500 mg | ORAL_TABLET | Freq: Two times a day (BID) | ORAL | 0 refills | Status: DC | PRN

## 2016-12-01 MED FILL — ESCITALOPRAM 10 MG TABLET: 10 | 30 days supply | Qty: 30 | Fill #0

## 2016-12-01 MED FILL — LORazepam 0.5 MG TABS: 0.5 | 6 days supply | Qty: 12 | Fill #0

## 2016-12-01 NOTE — Progress Notes (Signed)
Subjective:  Courtney ParisChristina J Pearlman is a 35 y.o. year old very pleasant female patient who presents for/with See problem oriented charting ROS- denies depressed mood. No si/hi. Admits to anxiety. Poor sleep due to anxiety.    Past Medical History-  Patient Active Problem List   Diagnosis Date Noted  . Anxiety 06/20/2012    Priority: Medium  . Floaters 12/15/2015    Priority: Low    Medications- reviewed and updated Current Outpatient Prescriptions  Medication Sig Dispense Refill  . LARIN 1/20 1-20 MG-MCG tablet   4   No current facility-administered medications for this visit.     Objective: BP 118/82 (BP Location: Left Arm, Patient Position: Sitting, Cuff Size: Normal)   Pulse (!) 107   Temp 97.6 F (36.4 C) (Oral)   Ht 5\' 6"  (1.676 m)   Wt 152 lb 6.4 oz (69.1 kg)   SpO2 97%   BMI 24.60 kg/m  Gen: Anxious appearing  Assessment/Plan:  Anxiety S: severe anxiety related to being a parent- denies much anxiety with work or when child is outside of her direct supervision such as when child at daycare. Daughter Kara Meadmma almost 5818 months old. Saw GYN in February 2017 and he thought she had some postpartum depression or more anxiety. She took zoloft for a few days- made her a little nauseous. She thought " I can control this" and she stopped taking it. Once a year old , she thought would get better but she feels like it has gotten worse. Ok in the day such as with daycare. Up to 5-6x a night checking on monitor. Really irrational thoughts like someone is going to break in and take her and I wont hear him or she may pass away and I may never know until the AM. Has not had a good nights rest ever since then. Dragging at work due to the poor sleep.   10 years ago parents got depressed- was on lexapro, xanax, and ambien short term. Tolerated lexapro well  Of note- On birth control. Not breastfeeding A/P: severe anxiety primarily revolving around daughter. Advised counseling. Given severity and  help with lexapro in past for depression- patient agrees to restart lexapro (no depression or SI at present though). Short term ativan if short term lexapro worsens anxiety- also may help her with sleep. Follow up 6 weeks.   6 weeks  Meds ordered this encounter  Medications  . escitalopram (LEXAPRO) 10 MG tablet    Sig: Take 1 tablet (10 mg total) by mouth daily.    Dispense:  30 tablet    Refill:  3  . LORazepam (ATIVAN) 0.5 MG tablet    Sig: Take 0.5-1 tablets (0.25-0.5 mg total) by mouth 2 (two) times daily as needed for anxiety.    Dispense:  12 tablet    Refill:  0   The duration of face-to-face time during this visit was greater than 15 minutes. Greater than 50% of this time was spent in counseling about anxiety and treatment options.   Return precautions advised.  Tana ConchStephen Darleny Sem, MD

## 2016-12-01 NOTE — Progress Notes (Signed)
Pre visit review using our clinic review tool, if applicable. No additional management support is needed unless otherwise documented below in the visit note. 

## 2016-12-01 NOTE — Patient Instructions (Signed)
Anxiety- start lexapro 10mg  daily. Follow up 6 weeks. Can use ativan sparingly for anxiety especially if lexapro worsens anxiety short term. Do not drive for 8 hours after taking  Taking the medicine as directed and not missing any doses is one of the best things you can do to treat your anxiety.  Here are some things to keep in mind:  1) Side effects (stomach upset, some increased anxiety) may happen before you notice a benefit.  These side effects typically go away over time. 2) Changes to your dose of medicine or a change in medication all together is sometimes necessary 3) Most people need to be on medication at least 6-12 months 4) Many people will notice an improvement within two weeks but the full effect of the medication can take up to 4-6 weeks 5) Stopping the medication when you start feeling better often results in a return of symptoms 6) If you start having thoughts of hurting yourself or others after starting this medicine, call our office immediately at [phone] or seek care through 911.

## 2016-12-01 NOTE — Assessment & Plan Note (Signed)
S: severe anxiety related to being a parent- denies much anxiety with work or when child is outside of her direct supervision such as when child at daycare. Daughter Kara Meadmma almost 818 months old. Saw GYN in February 2017 and he thought she had some postpartum depression or more anxiety. She took zoloft for a few days- made her a little nauseous. She thought " I can control this" and she stopped taking it. Once a year old , she thought would get better but she feels like it has gotten worse. Ok in the day such as with daycare. Up to 5-6x a night checking on monitor. Really irrational thoughts like someone is going to break in and take her and I wont hear him or she may pass away and I may never know until the AM. Has not had a good nights rest ever since then. Dragging at work due to the poor sleep.   10 years ago parents got depressed- was on lexapro, xanax, and ambien short term. Tolerated lexapro well  Of note- On birth control. Not breastfeeding A/P: severe anxiety primarily revolving around daughter. Advised counseling. Given severity and help with lexapro in past for depression- patient agrees to restart lexapro (no depression or SI at present though). Short term ativan if short term lexapro worsens anxiety- also may help her with sleep. Follow up 6 weeks.

## 2016-12-28 MED FILL — ESCITALOPRAM 10 MG TABLET: 10 | 30 days supply | Qty: 30 | Fill #1

## 2017-01-23 MED FILL — ESCITALOPRAM 10 MG TABLET: 10 | 30 days supply | Qty: 30 | Fill #2

## 2017-01-25 MED FILL — LARIN 21 1-20 TABLET: 1-20 | 84 days supply | Qty: 84 | Fill #1

## 2017-02-02 ENCOUNTER — Ambulatory Visit: Payer: 59 | Admitting: Family Medicine

## 2017-02-05 ENCOUNTER — Ambulatory Visit: Payer: 59 | Admitting: Family Medicine

## 2017-02-16 ENCOUNTER — Ambulatory Visit: Payer: 59 | Admitting: Family Medicine

## 2017-02-23 ENCOUNTER — Encounter: Payer: Self-pay | Admitting: Family Medicine

## 2017-02-23 ENCOUNTER — Ambulatory Visit (INDEPENDENT_AMBULATORY_CARE_PROVIDER_SITE_OTHER): Payer: 59 | Admitting: Family Medicine

## 2017-02-23 VITALS — BP 112/70 | HR 101 | Temp 98.3°F | Ht 66.0 in | Wt 144.0 lb

## 2017-02-23 DIAGNOSIS — F419 Anxiety disorder, unspecified: Secondary | ICD-10-CM | POA: Diagnosis not present

## 2017-02-23 MED ORDER — ESCITALOPRAM OXALATE 10 MG PO TABS: 10.0000 mg | ORAL_TABLET | Freq: Every day | ORAL | 3 refills | Status: DC

## 2017-02-23 MED FILL — ESCITALOPRAM 10 MG TABLET: 10 | 90 days supply | Qty: 90 | Fill #0

## 2017-02-23 NOTE — Patient Instructions (Signed)
Glad the anxiety is so much better continue lexapro 10mg   Schedule a physical in 6-12 months so we can reassess. Definitely let me know if plan to try for pregnancy or if any thoughts of hurting yourself.   ______________________________________________________________________  Starting October 1st 2018, I will be transferring to our new location: [facility] [address]- [place] from [place]) [place] [id number] Phone: [phone]  I would love to have you remain my patient at this new location as long as it remains convenient for you. I am excited about the opportunity to have x-ray and sports medicine in the new building but will really miss the awesome staff and physicians at [facility]. Continue to schedule appointments at [facility] and we will automatically transfer them to the [facility] location starting October 1st.

## 2017-02-23 NOTE — Progress Notes (Signed)
Subjective:  Caitlin Christensen is a 36 y.o. year old very pleasant female patient who presents for/with See problem oriented charting ROS- no SI/HI. No depressed mood. No chest pain or shortness of breath  Past Medical History-  Patient Active Problem List   Diagnosis Date Noted  . Anxiety 06/20/2012    Priority: Medium  . Floaters 12/15/2015    Priority: Low   Medications- reviewed and updated Current Outpatient Prescriptions  Medication Sig Dispense Refill  . escitalopram (LEXAPRO) 10 MG tablet Take 1 tablet (10 mg total) by mouth daily. 90 tablet 3  . LARIN 1/20 1-20 MG-MCG tablet   4   No current facility-administered medications for this visit.     Objective: BP 112/70 (BP Location: Left Arm, Patient Position: Sitting, Cuff Size: Normal)   Pulse (!) 101   Temp 98.3 F (36.8 C) (Oral)   Ht 5\' 6"  (1.676 m)   Wt 144 lb (65.3 kg)   SpO2 96%   BMI 23.24 kg/m  Gen: NAD, resting comfortably  Assessment/Plan:  Anxiety S: severe anxiety as parent. We opted at last visit to trial lexapro 10mg  starting in December. No depression. Ativan was given in case anxiety increase in beginning of lexapro.   Took about 4 weeks but then started to feel significantly better. Used ativan a few times but only 4x. No longer having thoughts that someone is going to come kidnap her. Not as worried about medical issues- had the flu and will need adenoids out as well but has tolerated well.   On birth control. No breastfeeding.  A/P: much improved. She would prefer long term therapy likely given prior history depression/anxiety and this is a recurrence. We did discuss would want to sit down and talk if she decides she wants another child and SI precautions. Follow up CPE 6-12 months  D/c ativan as not using  6-12 month cpe with recheck on anxiety  Meds ordered this encounter  Medications  . escitalopram (LEXAPRO) 10 MG tablet    Sig: Take 1 tablet (10 mg total) by mouth daily.    Dispense:   90 tablet    Refill:  3   The duration of face-to-face time during this visit was greater than 15 minutes. Greater than 50% of this time was spent in counseling, explanation of diagnosis, planning of further management, and/or coordination of care including primarily discussion of anxiety stressors as well as discussion on if she decides to get pregnant.    Return precautions advised.  Tana ConchStephen Lynnleigh Soden, MD

## 2017-02-23 NOTE — Progress Notes (Signed)
Pre visit review using our clinic review tool, if applicable. No additional management support is needed unless otherwise documented below in the visit note. 

## 2017-02-23 NOTE — Assessment & Plan Note (Addendum)
S: severe anxiety as parent. We opted at last visit to trial lexapro 10mg  starting in December. No depression. Ativan was given in case anxiety increase in beginning of lexapro.   Took about 4 weeks but then started to feel significantly better. Used ativan a few times but only 4x. No longer having thoughts that someone is going to come kidnap her. Not as worried about medical issues- had the flu and will need adenoids out as well but has tolerated well.   On birth control. No breastfeeding.  A/P: much improved. She would prefer long term therapy likely given prior history depression/anxiety and this is a recurrence. We did discuss would want to sit down and talk if she decides she wants another child and SI precautions. Follow up CPE 6-12 months  D/c ativan as not using

## 2017-03-12 DIAGNOSIS — J352 Hypertrophy of adenoids: Secondary | ICD-10-CM | POA: Diagnosis not present

## 2017-04-30 MED FILL — LARIN 21 1-20 TABLET: 1-20 | 84 days supply | Qty: 105 | Fill #2

## 2017-05-28 MED FILL — ESCITALOPRAM 10 MG TABLET: 10 | 90 days supply | Qty: 90 | Fill #1

## 2017-08-16 MED FILL — ESCITALOPRAM 10 MG TABLET: 10 | 90 days supply | Qty: 90 | Fill #2

## 2017-08-16 MED FILL — NORETHIND-ETH ESTRAD 1-0.02: 1-20 | 84 days supply | Qty: 84 | Fill #3

## 2017-11-22 MED FILL — ESCITALOPRAM 10 MG TABLET: 10 | 90 days supply | Qty: 90 | Fill #3

## 2017-11-23 MED FILL — NORETHIND-ETH ESTRAD 1-0.02: 1-20 | 84 days supply | Qty: 84 | Fill #0

## 2018-01-08 DIAGNOSIS — Z01419 Encounter for gynecological examination (general) (routine) without abnormal findings: Secondary | ICD-10-CM | POA: Diagnosis not present

## 2018-01-08 DIAGNOSIS — Z6824 Body mass index (BMI) 24.0-24.9, adult: Secondary | ICD-10-CM | POA: Diagnosis not present

## 2018-01-08 DIAGNOSIS — Z3202 Encounter for pregnancy test, result negative: Secondary | ICD-10-CM | POA: Diagnosis not present

## 2018-01-08 LAB — HM PAP SMEAR: HM PAP: NEGATIVE

## 2018-02-05 ENCOUNTER — Ambulatory Visit (INDEPENDENT_AMBULATORY_CARE_PROVIDER_SITE_OTHER): Payer: 59 | Admitting: Family Medicine

## 2018-02-05 ENCOUNTER — Encounter: Payer: Self-pay | Admitting: Family Medicine

## 2018-02-05 VITALS — BP 108/76 | HR 81 | Temp 97.9°F | Ht 66.5 in | Wt 153.0 lb

## 2018-02-05 DIAGNOSIS — Z0000 Encounter for general adult medical examination without abnormal findings: Secondary | ICD-10-CM

## 2018-02-05 DIAGNOSIS — F419 Anxiety disorder, unspecified: Secondary | ICD-10-CM | POA: Diagnosis not present

## 2018-02-05 MED ORDER — ESCITALOPRAM OXALATE 10 MG PO TABS: 10.0000 mg | ORAL_TABLET | Freq: Every day | ORAL | 3 refills | Status: DC

## 2018-02-05 MED ORDER — HYDROXYZINE HCL 25 MG PO TABS: 25.0000 mg | ORAL_TABLET | Freq: Two times a day (BID) | ORAL | 3 refills | Status: DC | PRN

## 2018-02-05 NOTE — Assessment & Plan Note (Signed)
Anxiety- has been well controlled on lexapro 10mg . Really takes the edge off the anxiety for her so will continue. Has benefited from sparing vistaril in the past- would like to retrial. Has some old pills and using 1-2x a week.

## 2018-02-05 NOTE — Progress Notes (Signed)
Phone: [phone]  Subjective:  Patient presents today for their annual physical. Chief complaint-noted.   See problem oriented charting- ROS- full  review of systems was completed and negative except for: nausea, sneezing, post nasal drip, anxiety  The following were reviewed and entered/updated in epic: Past Medical History:  Diagnosis Date  . GERD (gastroesophageal reflux disease) 2016   with pregnancy only   Patient Active Problem List   Diagnosis Date Noted  . Anxiety 06/20/2012    Priority: Medium  . Floaters 12/15/2015    Priority: Low   Past Surgical History:  Procedure Laterality Date  . CESAREAN SECTION N/A 06/25/2015   Procedure: CESAREAN SECTION;  Surgeon: Everlene Farrier, MD;  Location: [facility];  Service: Obstetrics;  Laterality: N/A;  primary   . HERNIA REPAIR     inguinal as baby  . LIPOSUCTION MULTIPLE BODY PARTS     Stomach and hips  . typanostomy tubes    . WISDOM TOOTH EXTRACTION  1999    Family History  Problem Relation Age of Onset  . Arthritis Father   . Hearing loss Father   . Retinal detachment Mother   . Stroke Maternal Grandmother   . Diabetes Neg Hx   . Drug abuse Neg Hx   . Early death Neg Hx   . Heart disease Neg Hx   . Hyperlipidemia Neg Hx   . Hypertension Neg Hx   . Kidney disease Neg Hx   . Cancer Neg Hx     Medications- reviewed and updated Current Outpatient Medications  Medication Sig Dispense Refill  . escitalopram (LEXAPRO) 10 MG tablet Take 1 tablet (10 mg total) by mouth daily. 90 tablet 3  . LARIN 1/20 1-20 MG-MCG tablet   4  . hydrOXYzine (ATARAX/VISTARIL) 25 MG tablet Take 1 tablet (25 mg total) by mouth 2 (two) times daily as needed for anxiety. 60 tablet 3   No current facility-administered medications for this visit.     Allergies-reviewed and updated Allergies  Allergen Reactions  . Seasonique [Levonorgestrel-Ethinyl Estrad] Hives    Social History   Social History Narrative   Married to [place]. Daughter  Terrence Dupont born 06/25/2015.  Has a dog.       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 Enjoys being active- working to get last 10 lbs of baby weight, outdoor activities, hiking    Objective: BP 108/76 (BP Location: Left Arm, Patient Position: Sitting, Cuff Size: Normal)   Pulse 81   Temp 97.9 F (36.6 C) (Oral)   Ht 5' 6.5" (1.689 m)   Wt 153 lb (69.4 kg)   LMP 11/19/2017 Comment: takes birthcontrol 3 months, period 4th month  SpO2 98%   BMI 24.32 kg/m  Gen: NAD, resting comfortably HEENT: Mucous membranes are moist. Oropharynx normal Neck: no thyromegaly CV: RRR no murmurs rubs or gallops Lungs: CTAB no crackles, wheeze, rhonchi Abdomen: soft/nontender/nondistended/normal bowel sounds. No rebound or guarding.  Ext: no edema Skin: warm, dry Neuro: grossly normal, moves all extremities, PERRLA  Assessment/Plan:  37 y.o. female presenting for annual physical.  Health Maintenance counseling: 1. Anticipatory guidance: Patient counseled regarding regular dental exams -q6 months, eye exams -no recent eye visit- floaters drastically improved, wearing seatbelts.  2. Risk factor reduction:  Advised patient of need for regular exercise and diet rich and fruits and vegetables to reduce risk of heart attack and stroke. Exercise- was doing well summer and fall and struggling lately- encouraged her to  restart. Diet-did weight watchers last year and now doing some of that - she has a hard time getting below 147 consistently on home scales.  Wt Readings from Last 3 Encounters:  02/05/18 153 lb (69.4 kg)  02/23/17 144 lb (65.3 kg)  12/01/16 152 lb 6.4 oz (69.1 kg)  3. Immunizations/screenings/ancillary studies Immunization History  Administered Date(s) Administered  . Influenza Whole 10/03/2012  . Influenza-Unspecified 09/25/2015, 09/25/2016, 09/17/2017  . MMR 06/27/2015  . Tdap 09/15/2008  4. Cervical cancer screening- [facility] last 08/09/15 - also had one 3  weeks ago which was normal 5. Breast cancer screening-  breast exam with GYN and mammogram - likely yearly at 53. Had early ultrasound with pregnancy- this was reassuring.  6. Colon cancer screening -  no family history, start at age 41-50 7. Skin cancer screening- Dr. Delman Cheadle - last visit 2015. advised regular sunscreen use. Denies worrisome, changing, or new skin lesions.  8. Birth control/STD check- remains on birth control. Monogamous/no concerns.    Status of chronic or acute concerns   Nausea at night once or twice a month- has been a long term issues. Thinks anxiety contributes - usually when more anxious  Laryngitis twice a year- using flonase prn and clairtin prn- also uses advil cold and sinus (sudafed plus advil). Sinus drainage at night.   Some nosebleeds at night- advised nasal saline.   Anxiety Anxiety- has been well controlled on lexapro 8m. Really takes the edge off the anxiety for her so will continue. Has benefited from sparing vistaril in the past- would like to retrial. Has some old pills and using 1-2x a week.  Meds ordered this encounter  Medications  . escitalopram (LEXAPRO) 10 MG tablet    Sig: Take 1 tablet (10 mg total) by mouth daily.    Dispense:  90 tablet    Refill:  3  . hydrOXYzine (ATARAX/VISTARIL) 25 MG tablet    Sig: Take 1 tablet (25 mg total) by mouth 2 (two) times daily as needed for anxiety.    Dispense:  60 tablet    Refill:  3   Return precautions advised.  SGarret Reddish MD

## 2018-02-05 NOTE — Patient Instructions (Addendum)
Sign release of information at the check out desk for last pap smear from GYN  Only suggestion is to try to find some enjoyable exercise activities for winter time and to try nasal saline particularly before bed or humidifier  Schedule physical in 1 year- come in fasting and we will update labs

## 2018-02-13 MED FILL — hydrOXYzine HCL 25 MG TABS: 25 | 30 days supply | Qty: 60 | Fill #0

## 2018-02-26 MED FILL — ESCITALOPRAM 10 MG TABLET: 10 | 90 days supply | Qty: 90 | Fill #0

## 2018-03-04 MED FILL — NORETHIND-ETH ESTRAD 1-0.02: 1-20 | 63 days supply | Qty: 84 | Fill #0

## 2018-03-05 ENCOUNTER — Encounter: Payer: Self-pay | Admitting: Family Medicine

## 2018-03-06 ENCOUNTER — Encounter: Payer: Self-pay | Admitting: Family Medicine

## 2018-03-15 ENCOUNTER — Ambulatory Visit: Payer: 59 | Admitting: Family Medicine

## 2018-03-15 ENCOUNTER — Encounter: Payer: Self-pay | Admitting: Family Medicine

## 2018-03-15 ENCOUNTER — Ambulatory Visit (INDEPENDENT_AMBULATORY_CARE_PROVIDER_SITE_OTHER): Payer: 59

## 2018-03-15 VITALS — BP 104/68 | HR 85 | Temp 98.6°F | Wt 154.0 lb

## 2018-03-15 DIAGNOSIS — S300XXA Contusion of lower back and pelvis, initial encounter: Secondary | ICD-10-CM | POA: Diagnosis not present

## 2018-03-15 DIAGNOSIS — M546 Pain in thoracic spine: Secondary | ICD-10-CM | POA: Diagnosis not present

## 2018-03-15 DIAGNOSIS — S299XXA Unspecified injury of thorax, initial encounter: Secondary | ICD-10-CM | POA: Diagnosis not present

## 2018-03-15 NOTE — Progress Notes (Signed)
   Caitlin Christensen is a 37 y.o. female here for an acute visit.  History of Present Illness:   Caitlin Christensen, CMA acting as scribe for Dr. Helane RimaErica Dajane Valli.   HPI: Patient had fall down stairs two weeks ago. Having pain in middle of back around where bra strap is. Pain does not radiate. It is worse with bending and twisting. Pain is 2/10 but at night can get to 4. She also has tenderness right side of lower back. She has tried Ibuprofen, heat and tylenol with mild improvement.    PMHx, SurgHx, SocialHx, Medications, and Allergies were reviewed in the Visit Navigator and updated as appropriate.  Current Medications:   .  escitalopram (LEXAPRO) 10 MG tablet, Take 1 tablet (10 mg total) by mouth daily., Disp: 90 tablet, Rfl: 3 .  hydrOXYzine (ATARAX/VISTARIL) 25 MG tablet, Take 1 tablet (25 mg total) by mouth 2 (two) times daily as needed for anxiety., Disp: 60 tablet, Rfl: 3 .  LARIN 1/20 1-20 MG-MCG tablet, , Disp: , Rfl: 4   Allergies  Allergen Reactions  . Seasonique [Levonorgestrel-Ethinyl Estrad] Hives   Review of Systems:   Pertinent items are noted in the HPI. Otherwise, ROS is negative.  Vitals:   Vitals:   03/15/18 0941  BP: 104/68  Pulse: 85  Temp: 98.6 F (37 C)  TempSrc: Oral  SpO2: 99%  Weight: 154 lb (69.9 kg)     Body mass index is 24.48 kg/m.  Physical Exam:   Physical Exam  Constitutional: She is oriented to person, place, and time. She appears well-developed and well-nourished. No distress.  HENT:  Head: Normocephalic and atraumatic.  Right Ear: External ear normal.  Left Ear: External ear normal.  Nose: Nose normal.  Mouth/Throat: Oropharynx is clear and moist.  Eyes: Pupils are equal, round, and reactive to light. Conjunctivae and EOM are normal.  Neck: Normal range of motion. Neck supple. No thyromegaly present.  Cardiovascular: Normal rate, regular rhythm, normal heart sounds and intact distal pulses.  Pulmonary/Chest: Effort normal and  breath sounds normal.  Abdominal: Soft. Bowel sounds are normal.  Musculoskeletal: Normal range of motion. TTP thoracic spinous process. No bruising to thoracic spine. She does have a bruise just below her right SI joint.  Neurological: She is alert and oriented to person, place, and time.  Skin: Skin is warm and dry. Capillary refill takes less than 2 seconds.  Psychiatric: She has a normal mood and affect. Her behavior is normal.  Nursing note and vitals reviewed.    Assessment and Plan:   1. Acute midline thoracic back pain No identified fracture. If pain does not resolve in 2 weeks, to [facility].  - DG Thoracic Spine W/Swimmers  2. Contusion of sacrum, initial encounter   . Reviewed expectations re: course of current medical issues. . Discussed self-management of symptoms. . Outlined signs and symptoms indicating need for more acute intervention. . Patient verbalized understanding and all questions were answered. Marland Kitchen. Health Maintenance issues including appropriate healthy diet, exercise, and smoking avoidance were discussed with patient. . See orders for this visit as documented in the electronic medical record. . Patient received an After Visit Summary.  CMA served as Neurosurgeonscribe during this visit. History, Physical, and Plan performed by medical provider. The above documentation has been reviewed and is accurate and complete. Helane RimaErica Jamareon Shimel, D.O.  Helane RimaErica Avabella Wailes, DO [facility] 03/15/2018

## 2018-05-28 MED FILL — ESCITALOPRAM 10 MG TABLET: 10 | 90 days supply | Qty: 90 | Fill #1

## 2018-05-28 MED FILL — hydrOXYzine HCL 25 MG TABS: 25 | 30 days supply | Qty: 60 | Fill #1

## 2018-05-28 MED FILL — NORETHIND-ETH ESTRAD 1-0.02: 1-20 | 63 days supply | Qty: 84 | Fill #1

## 2018-08-08 NOTE — Progress Notes (Signed)
Caitlin ScaleZach Virginia Christensen D.O. [facility] [address] [place], [place] [address] Phone: [phone] Subjective:   Referred by Dr. Earlene PlaterWallace  CC: Back pain  Caitlin Christensen:Subjective  Caitlin Christensen is a 37 y.o. female coming in with complaint of back pain. Last seen in 2013 for back pain. Works in labor and delivery. Larey SeatFell down the stairs back in June. States her back has gotten progressively worse. Saw Dr. Earlene PlaterWallace after her fall. She explained that she is careful at work and keeps in mind her body mechanics. On her feet a lot at work. Does a lot of push and pull. Can't get comfortable at night. No numbness and tingling noted.   Onset- June Location- Lower back  Duration- Worse at night  Character- Nagging tightness Aggravating factors- Car rides Reliving factors- Heat Therapies tried- Heat, tylenol, Ibuprofen  Severity-7 out of 10     Past Medical History:  Diagnosis Date  . GERD (gastroesophageal reflux disease) 2016   with pregnancy only   Past Surgical History:  Procedure Laterality Date  . CESAREAN SECTION N/A 06/25/2015   Procedure: CESAREAN SECTION;  Surgeon: Harold HedgeJames Tomblin, MD;  Location: [facility];  Service: Obstetrics;  Laterality: N/A;  primary   . HERNIA REPAIR     inguinal as baby  . LIPOSUCTION MULTIPLE BODY PARTS     Stomach and hips  . typanostomy tubes    . WISDOM TOOTH EXTRACTION  1999   Social History   Socioeconomic History  . Marital status: Married    Spouse name: Not on file  . Number of children: Not on file  . Years of education: Not on file  . Highest education level: Not on file  Occupational History  . Not on file  Social Needs  . Financial resource strain: Not on file  . Food insecurity:    Worry: Not on file    Inability: Not on file  . Transportation needs:    Medical: Not on file    Non-medical: Not on file  Tobacco Use  . Smoking status: Never Smoker  . Smokeless tobacco: Never Used  Substance and Sexual Activity  . Alcohol use: Yes   Alcohol/week: 0.0 - 1.0 standard drinks    Comment: social only  . Drug use: No  . Sexual activity: Yes  Lifestyle  . Physical activity:    Days per week: Not on file    Minutes per session: Not on file  . Stress: Not on file  Relationships  . Social connections:    Talks on phone: Not on file    Gets together: Not on file    Attends religious service: Not on file    Active member of club or organization: Not on file    Attends meetings of clubs or organizations: Not on file    Relationship status: Not on file  Other Topics Concern  . Not on file  Social History Narrative   Married to [place] Negrin TorresBrian. Daughter Kara Meadmma born 06/25/2015.  Has a dog.       works as L&D Engineer, civil (consulting)nurse at [employer].       Hobbies: sleeping! Enjoys being active- working to get last 10 lbs of baby weight, outdoor activities, hiking   Allergies  Allergen Reactions  . Seasonique [Levonorgestrel-Ethinyl Estrad] Hives   Family History  Problem Relation Age of Onset  . Arthritis Father   . Hearing loss Father   . Retinal detachment Mother   . Stroke Maternal Grandmother   . Diabetes Neg  Hx   . Drug abuse Neg Hx   . Early death Neg Hx   . Heart disease Neg Hx   . Hyperlipidemia Neg Hx   . Hypertension Neg Hx   . Kidney disease Neg Hx   . Cancer Neg Hx      Past medical history, social, surgical and family history all reviewed in electronic medical record.  No pertanent information unless stated regarding to the chief complaint.   Review of Systems:Review of systems updated and as accurate as of 08/08/18  No headache, visual changes, nausea, vomiting, diarrhea, constipation, dizziness, abdominal pain, skin rash, fevers, chills, night sweats, weight loss, swollen lymph nodes, body aches, joint swelling,  chest pain, shortness of breath, mood changes.  Positive muscle aches  Objective  There were no vitals taken for this visit. Systems examined below as of 08/08/18   General: No apparent distress alert and  oriented x3 mood and affect normal, dressed appropriately.  HEENT: Pupils equal, extraocular movements intact  Respiratory: Patient's speak in full sentences and does not appear short of breath  Cardiovascular: No lower extremity edema, non tender, no erythema  Skin: Warm dry intact with no signs of infection or rash on extremities or on axial skeleton.  Abdomen: Soft nontender  Neuro: Cranial nerves II through XII are intact, neurovascularly intact in all extremities with 2+ DTRs and 2+ pulses.  Lymph: No lymphadenopathy of posterior or anterior cervical chain or axillae bilaterally.  Gait normal with good balance and coordination.  MSK:  Non tender with full range of motion and good stability and symmetric strength and tone of shoulders, elbows, wrist, hip, knee and ankles bilaterally.  Back Exam:  Inspection: Mild loss of lordosis Motion: Flexion 45 deg, Extension 25 deg, Side Bending to 35 deg bilaterally,  Rotation to 45 deg bilaterally  SLR laying: Negative  XSLR laying: Negative  Palpable tenderness: Mild tenderness to palpation the paraspinal musculature lumbar spine right greater than left. FABER: Positive Faber on the right. Sensory change: Gross sensation intact to all lumbar and sacral dermatomes.  Reflexes: 2+ at both patellar tendons, 2+ at achilles tendons, Babinski's downgoing.  Strength at foot  Plantar-flexion: 5/5 Dorsi-flexion: 5/5 Eversion: 5/5 Inversion: 5/5  Leg strength  Quad: 5/5 Hamstring: 5/5 Hip flexor: 5/5 Hip abductors: 5/5  Gait unremarkable.  Osteopathic findings C2 flexed rotated and side bent right C4 flexed rotated and side bent left T3 extended rotated and side bent right inhaled third rib T9 extended rotated and side bent left L2 flexed rotated and side bent right Sacrum right on right    Impression and Recommendations:     This case required medical decision making of moderate complexity.      Note: This dictation was prepared with  Dragon dictation along with smaller phrase technology. Any transcriptional errors that result from this process are unintentional.

## 2018-08-09 MED FILL — hydrOXYzine HCL 25 MG TABS: 25 | 30 days supply | Qty: 60 | Fill #2

## 2018-08-12 ENCOUNTER — Ambulatory Visit: Payer: 59 | Admitting: Family Medicine

## 2018-08-12 ENCOUNTER — Encounter: Payer: Self-pay | Admitting: Family Medicine

## 2018-08-12 VITALS — BP 106/82 | HR 95 | Ht 66.5 in | Wt 151.0 lb

## 2018-08-12 DIAGNOSIS — M545 Low back pain, unspecified: Secondary | ICD-10-CM

## 2018-08-12 DIAGNOSIS — G8929 Other chronic pain: Secondary | ICD-10-CM

## 2018-08-12 DIAGNOSIS — M999 Biomechanical lesion, unspecified: Secondary | ICD-10-CM | POA: Insufficient documentation

## 2018-08-12 MED ORDER — DICLOFENAC SODIUM 2 % TD SOLN: 2.0000 g | Freq: Two times a day (BID) | TRANSDERMAL | 3 refills | Status: DC

## 2018-08-12 MED ORDER — VITAMIN D (ERGOCALCIFEROL) 1.25 MG (50000 UNIT) PO CAPS: 50000.0000 [IU] | ORAL_CAPSULE | ORAL | 0 refills | Status: DC

## 2018-08-12 MED ORDER — MELOXICAM 15 MG PO TABS: 15.0000 mg | ORAL_TABLET | Freq: Every day | ORAL | 3 refills | Status: DC

## 2018-08-12 MED FILL — VIT D2 1.25 MG (50,000 UNIT: 1.25 MG | 84 days supply | Qty: 12 | Fill #0

## 2018-08-12 MED FILL — MELOXICAM 15 MG TABLET: 15 | 30 days supply | Qty: 30 | Fill #0

## 2018-08-12 NOTE — Assessment & Plan Note (Signed)
Mechanical in nature.  I believe that this is an exacerbation.  Patient is working environment can make it difficult as well.  We discussed home exercise, topical anti-inflammatories given, discussed once weekly vitamin D for strength and endurance.  Discussed ergonomics and lifting mechanics.  Responded well to manipulation.  Follow-up again in 4 to 6 weeks.

## 2018-08-12 NOTE — Assessment & Plan Note (Signed)
Decision today to treat with OMT was based on Physical Exam  After verbal consent patient was treated with HVLA, ME, FPR techniques in cervical, thoracic, rib, lumbar and sacral areas  Patient tolerated the procedure well with improvement in symptoms  Patient given exercises, stretches and lifestyle modifications  See medications in patient instructions if given  Patient will follow up in 4-6 weeks 

## 2018-08-12 NOTE — Patient Instructions (Addendum)
Good to see you  You will do great  Ice 20 minutes 2 times daily. Usually after activity and before bed. pennsaid pinkie amount topically 2 times daily as needed.  Once weekly vitamin D for 12 weeks Stop ibuprofen start meloxicam daily for 10 days then as needed Exercises 3 times a week.  Wear good shoes Stay active.  See me again in 4 weeks

## 2018-08-28 MED FILL — ESCITALOPRAM 10 MG TABLET: 10 | 90 days supply | Qty: 90 | Fill #2

## 2018-08-29 ENCOUNTER — Other Ambulatory Visit: Payer: Self-pay

## 2018-08-29 ENCOUNTER — Telehealth: Payer: Self-pay | Admitting: Family Medicine

## 2018-08-29 MED ORDER — DICLOFENAC SODIUM 2 % TD SOLN: 2.0000 g | Freq: Two times a day (BID) | TRANSDERMAL | 3 refills | Status: DC

## 2018-08-29 NOTE — Telephone Encounter (Signed)
Copied from CRM [id number]. Topic: Quick Communication - Rx Refill/Question >> Aug 29, 2018 10:17 AM Zada Girt, Lumin L wrote: Medication: Diclofenac Sodium (PENNSAID) 2 % SOLN [facility] needs to know diagnosis and others medications she's tried before this for prior auth)  Has the patient contacted their pharmacy? Yes.   (Agent: If no, request that the patient contact the pharmacy for the refill.) (Agent: If yes, when and what did the pharmacy advise?)  Preferred Pharmacy (with phone number or street name): [phone] Fax:[phone]  Agent: Please be advised that RX refills may take up to 3 business days. We ask that you follow-up with your pharmacy.

## 2018-09-03 MED FILL — NORETHIND-ETH ESTRAD 1-0.02: 1-20 | 63 days supply | Qty: 84 | Fill #2

## 2018-09-15 MED FILL — MELOXICAM 15 MG TABLET: 15 | 30 days supply | Qty: 30 | Fill #1

## 2018-10-10 MED FILL — MELOXICAM 15 MG TABLET: 15 | 30 days supply | Qty: 30 | Fill #1

## 2018-11-26 MED FILL — ESCITALOPRAM 10 MG TABLET: 10 | 90 days supply | Qty: 90 | Fill #3

## 2018-12-05 MED FILL — hydrOXYzine HCL 25 MG TABS: 25 | 30 days supply | Qty: 60 | Fill #3

## 2018-12-05 MED FILL — NORETHIND-ETH ESTRAD 1-0.02: 1-20 | 63 days supply | Qty: 84 | Fill #3

## 2019-01-21 LAB — HEMOGLOBIN A1C: Hemoglobin A1C: 14.3

## 2019-01-23 ENCOUNTER — Other Ambulatory Visit: Payer: Self-pay | Admitting: Obstetrics and Gynecology

## 2019-01-23 DIAGNOSIS — N6320 Unspecified lump in the left breast, unspecified quadrant: Secondary | ICD-10-CM

## 2019-01-28 ENCOUNTER — Other Ambulatory Visit: Payer: 59

## 2019-01-30 ENCOUNTER — Ambulatory Visit
Admission: RE | Admit: 2019-01-30 | Discharge: 2019-01-30 | Disposition: A | Discharge status: Home / Self Care | Payer: No Typology Code available for payment source | Source: Ambulatory Visit | Attending: Obstetrics and Gynecology | Admitting: Obstetrics and Gynecology

## 2019-01-30 ENCOUNTER — Other Ambulatory Visit: Payer: Self-pay | Admitting: Obstetrics and Gynecology

## 2019-01-30 ENCOUNTER — Ambulatory Visit
Admission: RE | Admit: 2019-01-30 | Discharge: 2019-01-30 | Disposition: A | Discharge status: Home / Self Care | Payer: 59 | Source: Ambulatory Visit | Attending: Obstetrics and Gynecology | Admitting: Obstetrics and Gynecology

## 2019-01-30 DIAGNOSIS — N6320 Unspecified lump in the left breast, unspecified quadrant: Secondary | ICD-10-CM

## 2019-01-30 DIAGNOSIS — N6310 Unspecified lump in the right breast, unspecified quadrant: Secondary | ICD-10-CM

## 2019-01-30 LAB — HM MAMMOGRAPHY

## 2019-02-03 ENCOUNTER — Other Ambulatory Visit: Payer: Self-pay | Admitting: Obstetrics and Gynecology

## 2019-02-05 ENCOUNTER — Ambulatory Visit
Admission: RE | Admit: 2019-02-05 | Discharge: 2019-02-05 | Disposition: A | Discharge status: Home / Self Care | Payer: No Typology Code available for payment source | Source: Ambulatory Visit | Attending: Obstetrics and Gynecology | Admitting: Obstetrics and Gynecology

## 2019-02-05 DIAGNOSIS — N6310 Unspecified lump in the right breast, unspecified quadrant: Secondary | ICD-10-CM

## 2019-02-05 HISTORY — PX: BREAST BIOPSY: SHX20

## 2019-02-06 DIAGNOSIS — N6489 Other specified disorders of breast: Secondary | ICD-10-CM

## 2019-02-06 HISTORY — DX: Other specified disorders of breast: N64.89

## 2019-02-11 ENCOUNTER — Other Ambulatory Visit: Payer: Self-pay | Admitting: Family Medicine

## 2019-02-11 MED FILL — hydrOXYzine HCL 25 MG TABS: 25 | 30 days supply | Qty: 60 | Fill #0

## 2019-02-20 MED FILL — AMOXICILLIN 875 MG TABS: 875 | 10 days supply | Qty: 20 | Fill #0

## 2019-02-25 ENCOUNTER — Other Ambulatory Visit: Payer: Self-pay | Admitting: Family Medicine

## 2019-02-25 MED FILL — ESCITALOPRAM 10 MG TABLET: 10 | 90 days supply | Qty: 90 | Fill #0

## 2019-02-25 MED FILL — NORETHIND-ETH ESTRAD 1-0.02: 1-20 | 84 days supply | Qty: 84 | Fill #0

## 2019-03-05 ENCOUNTER — Encounter: Payer: 59 | Admitting: Family Medicine

## 2019-03-13 ENCOUNTER — Encounter: Payer: Self-pay | Admitting: Family Medicine

## 2019-03-28 ENCOUNTER — Telehealth: Payer: Self-pay

## 2019-03-28 NOTE — Telephone Encounter (Signed)
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 Pt is wanting xanax due to increase issues at work. Pt has been scheduled for next Wed. This is the only time she is free since she gets of at 7pm.

## 2019-04-02 ENCOUNTER — Ambulatory Visit (INDEPENDENT_AMBULATORY_CARE_PROVIDER_SITE_OTHER): Payer: No Typology Code available for payment source | Admitting: Family Medicine

## 2019-04-02 ENCOUNTER — Encounter: Payer: Self-pay | Admitting: Family Medicine

## 2019-04-02 DIAGNOSIS — F419 Anxiety disorder, unspecified: Secondary | ICD-10-CM

## 2019-04-02 MED ORDER — ALPRAZOLAM 0.25 MG PO TABS: 0.2500 mg | ORAL_TABLET | Freq: Two times a day (BID) | ORAL | 1 refills | Status: DC | PRN

## 2019-04-02 MED FILL — ALPRAZolam 0.25 MG TABS: 0.25 | 30 days supply | Qty: 40 | Fill #0

## 2019-04-02 NOTE — Patient Instructions (Signed)
Video visit-plan ongoing PHQ 2 next visit at physical- acute stressors may elevate this in regards to covid-19

## 2019-04-02 NOTE — Assessment & Plan Note (Signed)
S: patient has had 4 covid patients- she was in OR caring for one of the patients. Patient is not doing well and baby is doing ok- has really peaked her anxiety. She has had trouble sleeping- will wake up and think about all the things going on- scared she will get it and give it to her family.   Stress levels have been high and has even made some mistakes at home like putting phone in microwave instead of hot pack. States having no issues at work- its just when she gets home she feels flooded by thoughts from the day.   She is still on lexapro 10mg .  Using hydroxyzine occasionally but feels like needs something stronger at home- particularly with sleep  Patient felt like had chest tightness on ativan but did not have issues on xanax.  A/P: Mild poor control of anxiety.  Continue Lexapro 10 mg.  Recommended patient consider counseling with [facility].  I do think a trial of low-dose Xanax would be reasonable over the next 2 months which are likely to be very difficult in regards to covid-19.

## 2019-04-02 NOTE — Progress Notes (Signed)
Phone [phone]   Subjective:  Virtual visit via Video note. Chief complaint: No chief complaint on file.   This visit type was conducted due to national recommendations for restrictions regarding the COVID-19 Pandemic (e.g. social distancing).  This format is felt to be most appropriate for this patient at this time balancing risks to patient and risks to population by having him in for in person visit.  All issues noted in this document were discussed and addressed.  No physical exam was performed (except for noted visual exam or audio findings with Telehealth visits).  The patient has consented to conduct a Telehealth visit and understands insurance will be billed.   Our team/I connected with Courtney Paris on 04/02/19 at 10:00 AM EDT by a video enabled telemedicine application (doxy.me) and verified that I am speaking with the correct person using two identifiers.  Location patient: Home-O[place] Location provider: [facility], office Persons participating in the virtual visit:  patient  Our team/I discussed the limitations of evaluation and management by telemedicine and the availability of in person appointments. In light of current covid-19 pandemic, patient also understands that we are trying to protect them by minimizing in office contact if at all possible.  The patient expressed consent for telemedicine visit and agreed to proceed. Patient understands insurance will be billed.   ROS-admits to increased anxiety.  No suicidal thoughts reported.  No chest pain or shortness of breath reported.  Admits to insomnia  Past Medical History-  Patient Active Problem List   Diagnosis Date Noted  . Anxiety 06/20/2012    Priority: Medium  . Floaters 12/15/2015    Priority: Low  . Nonallopathic lesion of sacral region 08/12/2018  . Nonallopathic lesion of rib cage 08/12/2018  . Low back pain 06/20/2012  . Nonallopathic lesion of cervical region 05/19/2011  . Nonallopathic lesion of  thoracic region 05/19/2011  . Nonallopathic lesion of lumbosacral region 05/19/2011    Medications- reviewed and updated Current Outpatient Medications  Medication Sig Dispense Refill  . Diclofenac Sodium (PENNSAID) 2 % SOLN Place 2 g onto the skin 2 (two) times daily. 112 g 3  . Diclofenac Sodium (PENNSAID) 2 % SOLN Place 2 g onto the skin 2 (two) times daily. 112 g 3  . escitalopram (LEXAPRO) 10 MG tablet TAKE 1 TABLET BY MOUTH ONCE DAILY 90 tablet 3  . hydrOXYzine (ATARAX/VISTARIL) 25 MG tablet TAKE 1 TABLET BY MOUTH TWICE DAILY AS NEEDED FOR ANXIETY 60 tablet 3  . LARIN 1/20 1-20 MG-MCG tablet   4  . meloxicam (MOBIC) 15 MG tablet Take 1 tablet (15 mg total) by mouth daily. 30 tablet 3  . Vitamin D, Ergocalciferol, (DRISDOL) 50000 units CAPS capsule Take 1 capsule (50,000 Units total) by mouth every 7 (seven) days. 12 capsule 0   No current facility-administered medications for this visit.      Objective:  No vitals were obtained Gen: NAD, resting comfortably Lungs: nonlabored, normal respiratory rate  Skin: appears dry, no obvious rash Able to walk through the house without difficulty-from respiratory and neurologic perspective     Assessment and Plan    Anxiety S: patient has had 4 covid patients- she was in OR caring for one of the patients. Patient is not doing well and baby is doing ok- has really peaked her anxiety. She has had trouble sleeping- will wake up and think about all the things going on- scared she will get it and give it to her family.   Stress  levels have been high and has even made some mistakes at home like putting phone in microwave instead of hot pack. States having no issues at work- its just when she gets home she feels flooded by thoughts from the day.   She is still on lexapro 10mg .  Using hydroxyzine occasionally but feels like needs something stronger at home- particularly with sleep  Patient felt like had chest tightness on ativan but did not have  issues on xanax.  A/P: Mild poor control of anxiety.  Continue Lexapro 10 mg.  Recommended patient consider counseling with [place] [facility].  I do think a trial of low-dose Xanax would be reasonable over the next 2 months which are likely to be very difficult in regards to covid-19.   June physical planned  Future Appointments  Date Time Provider Department Center  06/11/2019  8:20 AM [facility] MajesticHunter, Younique Casad O, MD [facility]   Meds ordered this encounter  Medications  . ALPRAZolam (XANAX) 0.25 MG tablet    Sig: Take 1 tablet (0.25 mg total) by mouth 2 (two) times daily as needed for anxiety or sleep. 1 refill per month    Dispense:  40 tablet    Refill:  1    Return precautions advised.  Tana ConchStephen Yolonda Purtle, MD

## 2019-05-05 MED FILL — hydrOXYzine HCL 25 MG TABS: 25 | 30 days supply | Qty: 60 | Fill #1

## 2019-05-05 MED FILL — NORETHIND-ETH ESTRAD 1-0.02: 1-20 | 84 days supply | Qty: 84 | Fill #0

## 2019-05-05 MED FILL — ALPRAZolam 0.25 MG TABS: 0.25 | 30 days supply | Qty: 40 | Fill #1

## 2019-05-30 MED FILL — hydrOXYzine HCL 25 MG TABS: 25 | 30 days supply | Qty: 60 | Fill #2

## 2019-05-30 MED FILL — ESCITALOPRAM 10 MG TABLET: 10 | 90 days supply | Qty: 90 | Fill #1

## 2019-06-11 ENCOUNTER — Other Ambulatory Visit: Payer: Self-pay

## 2019-06-11 ENCOUNTER — Ambulatory Visit (INDEPENDENT_AMBULATORY_CARE_PROVIDER_SITE_OTHER): Payer: No Typology Code available for payment source | Admitting: Family Medicine

## 2019-06-11 ENCOUNTER — Encounter: Payer: Self-pay | Admitting: Family Medicine

## 2019-06-11 VITALS — BP 110/78 | HR 70 | Temp 97.8°F | Ht 66.5 in | Wt 165.8 lb

## 2019-06-11 DIAGNOSIS — E785 Hyperlipidemia, unspecified: Secondary | ICD-10-CM | POA: Diagnosis not present

## 2019-06-11 DIAGNOSIS — Z0000 Encounter for general adult medical examination without abnormal findings: Secondary | ICD-10-CM

## 2019-06-11 LAB — CBC
HCT: 41.6 % (ref 36.0–46.0)
Hemoglobin: 13.9 g/dL (ref 12.0–15.0)
MCHC: 33.5 g/dL (ref 30.0–36.0)
MCV: 91.7 fl (ref 78.0–100.0)
Platelets: 330 10*3/uL (ref 150.0–400.0)
RBC: 4.53 Mil/uL (ref 3.87–5.11)
RDW: 12.9 % (ref 11.5–15.5)
WBC: 6 10*3/uL (ref 4.0–10.5)

## 2019-06-11 LAB — LIPID PANEL
Cholesterol: 209 mg/dL — ABNORMAL HIGH (ref 0–200)
HDL: 54.1 mg/dL (ref >39.00)
LDL Cholesterol: 122 mg/dL — ABNORMAL HIGH (ref 0–99)
NonHDL: 155.11
Total CHOL/HDL Ratio: 4
Triglycerides: 165 mg/dL — ABNORMAL HIGH (ref 0.0–149.0)
VLDL: 33 mg/dL (ref 0.0–40.0)

## 2019-06-11 LAB — COMPREHENSIVE METABOLIC PANEL
ALT: 12 U/L (ref 0–35)
AST: 13 U/L (ref 0–37)
Albumin: 4.2 g/dL (ref 3.5–5.2)
Alkaline Phosphatase: 35 U/L — ABNORMAL LOW (ref 39–117)
BUN: 13 mg/dL (ref 6–23)
CO2: 26 mEq/L (ref 19–32)
Calcium: 9 mg/dL (ref 8.4–10.5)
Chloride: 106 mEq/L (ref 96–112)
Creatinine, Ser: 0.72 mg/dL (ref 0.40–1.20)
GFR: 90.5 mL/min (ref >60.00)
Glucose, Bld: 78 mg/dL (ref 70–99)
Potassium: 4.6 mEq/L (ref 3.5–5.1)
Sodium: 138 mEq/L (ref 135–145)
Total Bilirubin: 0.4 mg/dL (ref 0.2–1.2)
Total Protein: 7 g/dL (ref 6.0–8.3)

## 2019-06-11 LAB — TSH: TSH: 1.29 u[IU]/mL (ref 0.35–4.50)

## 2019-06-11 MED ORDER — HYDROXYZINE HCL 25 MG PO TABS: 25.0000 mg | ORAL_TABLET | Freq: Two times a day (BID) | ORAL | 3 refills | Status: DC | PRN

## 2019-06-11 NOTE — Patient Instructions (Addendum)
Health Maintenance Due  Topic Date Due  . TETANUS/TDAP - Sign release of information at the check out desk for immunizations only from [facility] 09/15/2018   I would give it a little more time with the healthy eating and regular exercise- we can get you in with Inda Coke, PA and RD if you would lik eto go through diet a little more thoroughly to look for any weak spots  Please stop by lab before you go If you do not have mychart- we will call you about results within 5 business days of [place] receiving them.  If you have mychart- we will send your results within 3 business days of [place] receiving them.  If abnormal or we want to clarify a result, we will call or mychart you to make sure you receive the message.  If you have questions or concerns or don't hear within 5-7 days, please send [place] a message or call us.

## 2019-06-11 NOTE — Progress Notes (Signed)
Phone: [phone]   Subjective:  Patient presents today for their annual physical. Chief complaint-noted.   See problem oriented charting- ROS- full  review of systems was completed and negative except for: weight gain, seasonal allergies  The following were reviewed and entered/updated in epic: Past Medical History:  Diagnosis Date  . GERD (gastroesophageal reflux disease) 2016   with pregnancy only   Patient Active Problem List   Diagnosis Date Noted  . Anxiety 06/20/2012    Priority: Medium  . Floaters 12/15/2015    Priority: Low  . Nonallopathic lesion of sacral region 08/12/2018  . Nonallopathic lesion of rib cage 08/12/2018  . Low back pain 06/20/2012  . Nonallopathic lesion of cervical region 05/19/2011  . Nonallopathic lesion of thoracic region 05/19/2011  . Nonallopathic lesion of lumbosacral region 05/19/2011   Past Surgical History:  Procedure Laterality Date  . CESAREAN SECTION N/A 06/25/2015   Procedure: CESAREAN SECTION;  Surgeon: Everlene Farrier, MD;  Location: [facility];  Service: Obstetrics;  Laterality: N/A;  primary   . HERNIA REPAIR     inguinal as baby  . LIPOSUCTION MULTIPLE BODY PARTS     Stomach and hips  . typanostomy tubes    . WISDOM TOOTH EXTRACTION  1999    Family History  Problem Relation Age of Onset  . Arthritis Father   . Hearing loss Father   . Retinal detachment Mother   . Stroke Maternal Grandmother   . Diabetes Neg Hx   . Drug abuse Neg Hx   . Early death Neg Hx   . Heart disease Neg Hx   . Hyperlipidemia Neg Hx   . Hypertension Neg Hx   . Kidney disease Neg Hx   . Cancer Neg Hx     Medications- reviewed and updated Current Outpatient Medications  Medication Sig Dispense Refill  . ALPRAZolam (XANAX) 0.25 MG tablet Take 1 tablet (0.25 mg total) by mouth 2 (two) times daily as needed for anxiety or sleep. 1 refill per month 40 tablet 1  . escitalopram (LEXAPRO) 10 MG tablet TAKE 1 TABLET BY MOUTH ONCE DAILY 90 tablet 3  .  hydrOXYzine (ATARAX/VISTARIL) 25 MG tablet TAKE 1 TABLET BY MOUTH TWICE DAILY AS NEEDED FOR ANXIETY 60 tablet 3  . LARIN 1/20 1-20 MG-MCG tablet   4   No current facility-administered medications for this visit.     Allergies-reviewed and updated Allergies  Allergen Reactions  . Seasonique [Levonorgestrel-Ethinyl Estrad] Hives    Social History   Social History Narrative   Married to [place]. Daughter Terrence Dupont born 06/25/2015.  Has a dog.       works as L&D Marine scientist at [employer]: sleeping! Enjoys being active- working to get last 10 lbs of baby weight, outdoor activities, hiking   Objective  Objective:  BP 110/78 (BP Location: Left Arm, Patient Position: Sitting, Cuff Size: Normal)   Pulse 70   Temp 97.8 F (36.6 C) (Oral)   Ht 5' 6.5" (1.689 m)   Wt 165 lb 12.8 oz (75.2 kg)   LMP 02/23/2019   SpO2 100%   BMI 26.36 kg/m  Gen: NAD, resting comfortably HEENT: Mucous membranes are moist. Oropharynx normal Neck: no thyromegaly or cervical lymphadenopathy CV: RRR no murmurs rubs or gallops Lungs: CTAB no crackles, wheeze, rhonchi Abdomen: soft/nontender/nondistended/normal bowel sounds. No rebound or guarding.  Ext: no edema Skin: warm, dry Neuro: grossly normal, moves all extremities,    Assessment and Plan  38 y.o. female presenting for annual physical.  Health Maintenance counseling: 1. Anticipatory guidance: Patient counseled regarding regular dental exams -q6 months, eye exams -  Floaters in past but normal eye exam- does not go regularly,  avoiding smoking and second hand smoke , limiting alcohol to 1 beverage per day- like once a week at pool .   2. Risk factor reduction:  Advised patient of need for regular exercise and diet rich and fruits and vegetables to reduce risk of heart attack and stroke.  Got up to 165 and did weight watchers but things stayed stable, joined [employer] and goes everyday that she doesn't work- has been harder since gym has  been closed- has used ellipitical at home and done a lot of walking. Has not weighed this much since Terrence Dupont was born. Still doing weight watchers and has watch to keep up with steps. At work does 8-10k steps. Does some late night snacking at times on cereal perhaps 2-3x a week Special K. Tries to stay away from  desserts. Does protein drink before work.  -difficulty with weight loss despite eating healthy diet and exercising regularly- we will get tsh with labs under HLD. Discussed possibly sseeing Inda Coke for our long nutrition session if weight starts to bend up or if not making progress over next few months- does feel like clothes are fitting better which is a good sign.  Wt Readings from Last 3 Encounters:  06/11/19 165 lb 12.8 oz (75.2 kg)  06/10/15 196 lb 6.4 oz (89.1 kg)  01/01/13 150 lb (68 kg)  3. Immunizations/screenings/ancillary studies- try to get TDap records from pregnancy  Immunization History  Administered Date(s) Administered  . Influenza Whole 10/03/2012  . Influenza-Unspecified 09/25/2015, 09/25/2016, 09/17/2017  . MMR 06/27/2015  . Tdap 09/15/2008  4. Cervical cancer screening-  [facility]- 12/2017 on record and also had one 2020- reports normal 5. Breast cancer screening-  breast exam with GYN  And ended up finding lump on exam- ended up having biopsy but was benign.  - also had genetic testing pending results 6. Colon cancer screening - no family history, start at age 53-50 7. Skin cancer screening- dr. Delman Cheadle prn with last visit 2016- advised regular sunscreen use. Denies worrisome, changing, or new skin lesions.  8. Birth control/STD check- monogamous and on birth control 21. Osteoporosis screening at 59- will plan on this 10. never smoker  Status of chronic or acute concerns   #anxiety- had been high about 2 months ago as caring for covid patients on L&D which led to mistakes at home. Was consistent with lexapro and used hydroxyzine some. We advised  counseling and also trialed low dose xanax. She went through first bottle but has only used 4 in second one. Leaning more on the vistaril/hydroxyzine at home but does make her sleepy -doing well, continue current medicines.   1 year CPE Lab/Order associations: protein shake this morning with banana   ICD-10-CM   1. Preventative health care  Z00.00 CBC    Comprehensive metabolic panel    Lipid panel  2. Hyperlipidemia, unspecified hyperlipidemia type  E78.5 CBC    Comprehensive metabolic panel    Lipid panel    No orders of the defined types were placed in this encounter.   Return precautions advised.  Garret Reddish, MD

## 2019-06-12 ENCOUNTER — Telehealth: Payer: Self-pay | Admitting: Family Medicine

## 2019-06-12 NOTE — Telephone Encounter (Signed)
Pt called in and was given her lab results from Dr. Yong Channel dated 06/11/2019 at 3:59 PM.  No questions.

## 2019-06-25 ENCOUNTER — Encounter: Payer: Self-pay | Admitting: Physical Therapy

## 2019-06-25 ENCOUNTER — Encounter: Payer: Self-pay | Admitting: Family Medicine

## 2019-06-26 MED FILL — hydrOXYzine HCL 25 MG TABS: 25 | 30 days supply | Qty: 60 | Fill #2

## 2019-07-21 MED FILL — hydrOXYzine HCL 25 MG TABS: 25 | 30 days supply | Qty: 60 | Fill #3

## 2019-07-23 MED FILL — NORETHIND-ETH ESTRAD 1-0.02: 1-20 | 84 days supply | Qty: 84 | Fill #0

## 2019-08-19 MED FILL — hydrOXYzine HCL 25 MG TABS: 25 | 30 days supply | Qty: 60 | Fill #0

## 2019-08-25 ENCOUNTER — Other Ambulatory Visit: Payer: Self-pay

## 2019-08-25 MED ORDER — HYDROXYZINE HCL 25 MG PO TABS: 25.0000 mg | ORAL_TABLET | Freq: Two times a day (BID) | ORAL | 3 refills | Status: DC | PRN

## 2019-08-25 NOTE — Telephone Encounter (Signed)
Last OV 06/11/19 Last refill 06/11/19 #60/3 Next OV not scheduled  Forwarding to Dr. Yong Channel

## 2019-08-30 MED FILL — ESCITALOPRAM 10 MG TABLET: 10 | 90 days supply | Qty: 90 | Fill #2

## 2019-09-03 ENCOUNTER — Other Ambulatory Visit: Payer: Self-pay | Admitting: Family Medicine

## 2019-09-03 ENCOUNTER — Telehealth: Payer: Self-pay | Admitting: Physical Therapy

## 2019-09-03 MED ORDER — ALPRAZOLAM 0.25 MG PO TABS: 0.2500 mg | ORAL_TABLET | Freq: Two times a day (BID) | ORAL | 1 refills | Status: DC | PRN

## 2019-09-03 MED FILL — ALPRAZolam 0.25 MG TABS: 0.25 | 30 days supply | Qty: 40 | Fill #0

## 2019-09-03 NOTE — Telephone Encounter (Signed)
Copied from [place] [id number]. Topic: Quick Communication - Rx Refill/Question >> Sep 03, 2019 12:34 PM Caitlin Christensen, [place] A wrote: Medication: ALPRAZolam (XANAX) 0.25 MG tablet,pantoprazole (PROTONIX) 40 MG tablet (Patient has appt booked for 10/06/2019 and would like a supply to last until her upcoming appointment.)  Has the patient contacted their pharmacy? {Yes (Agent: If no, request that the patient contact the pharmacy for the refill.) (Agent: If yes, when and what did the pharmacy advise?)Contact PCP  Preferred Pharmacy (with phone number or street name): [place], [place] - [place] [phone] (Phone) [phone] (Fax)    Agent: Please be advised that RX refills may take up to 3 business days. We ask that you follow-up with your pharmacy.

## 2019-09-03 NOTE — Telephone Encounter (Signed)
See note

## 2019-09-03 NOTE — Telephone Encounter (Signed)
Pt would like call once refilled.

## 2019-09-03 NOTE — Telephone Encounter (Signed)
Last OV 06/11/19 Last refill 04/02/19 #40/1 Next OV 10/06/19  Forwarding to Dr. Yong Channel

## 2019-09-03 NOTE — Telephone Encounter (Signed)
Called pt and advised.  

## 2019-09-03 NOTE — Telephone Encounter (Signed)
Copied from [place] [id number]. Topic: General - Other >> Sep 03, 2019 12:39 PM Rainey Pines A wrote: Patient would like a callbackfrom nurse once medication have been sent to pharmacy.

## 2019-09-03 NOTE — Telephone Encounter (Signed)
Requested medication (s) are due for refill today: yes  Requested medication (s) are on the active medication list: yes  Last refill:  03/2019  Future visit scheduled: yes  Notes to clinic:  This refill cannot be delegated Patient has appointment 10/06/2019 and would like enough sent until appointment. Patient also requesting Pantoprazole but not on current medication list   Requested Prescriptions  Pending Prescriptions Disp Refills   ALPRAZolam (XANAX) 0.25 MG tablet 40 tablet 1    Sig: Take 1 tablet (0.25 mg total) by mouth 2 (two) times daily as needed for anxiety or sleep. 1 refill per month     Not Delegated - Psychiatry:  Anxiolytics/Hypnotics Failed - 09/03/2019 12:37 PM      Failed - This refill cannot be delegated      Failed - Urine Drug Screen completed in last 360 days.      Passed - Valid encounter within last 6 months    Recent Outpatient Visits          2 months ago Preventative health care   [place] Hunter, Brayton Mars, MD   5 months ago [place] Hunter, Brayton Mars, MD   1 year ago Chronic right-sided low back pain without sciatica   [place], [place], DO   1 year ago Acute midline thoracic back pain   [facility] Pour, Win[place], DO   1 year ago Preventative health care   [place] Hunter, Brayton Mars, MD      Future Appointments            In 1 month Hunter, Brayton Mars, MD [place], [place]

## 2019-10-03 NOTE — Progress Notes (Signed)
Phone [phone]   Subjective:  Virtual visit via Video note. Chief complaint: Chief Complaint  Patient presents with  . Follow-up  . medication f/u- GERD   This visit type was conducted due to national recommendations for restrictions regarding the COVID-19 Pandemic (e.g. social distancing).  This format is felt to be most appropriate for this patient at this time balancing risks to patient and risks to population by having him in for in person visit.  No physical exam was performed (except for noted visual exam or audio findings with Telehealth visits).    Our team/I connected with Loa Socks at  3:40 PM EDT by a video enabled telemedicine application (doxy.me or caregility through epic) and verified that I am speaking with the correct person using two identifiers.  Location patient: Home-O2 Location provider: [facility], office Persons participating in the virtual visit:  patient  Our team/I discussed the limitations of evaluation and management by telemedicine and the availability of in person appointments. In light of current covid-19 pandemic, patient also understands that we are trying to protect them by minimizing in office contact if at all possible.  The patient expressed consent for telemedicine visit and agreed to proceed. Patient understands insurance will be billed.   ROS-no fever/chills/cough.  Intermittent burning in throat and upper chest-no shortness of breath or left arm or neck pain reported.  Past Medical History-  Patient Active Problem List   Diagnosis Date Noted  . Anxiety 06/20/2012    Priority: Medium  . Floaters 12/15/2015    Priority: Low  . GERD (gastroesophageal reflux disease) 10/06/2019  . 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 Nonallopathic lesion of sacral region 08/12/2018  . Nonallopathic lesion of rib cage 08/12/2018  . Low back pain 06/20/2012  . Nonallopathic lesion of cervical region 05/19/2011  .  Nonallopathic lesion of thoracic region 05/19/2011  . Nonallopathic lesion of lumbosacral region 05/19/2011    Medications- reviewed and updated Current Outpatient Medications  Medication Sig Dispense Refill  . ALPRAZolam (XANAX) 0.25 MG tablet Take 1 tablet (0.25 mg total) by mouth 2 (two) times daily as needed for anxiety or sleep. 1 refill per month 40 tablet 1  . escitalopram (LEXAPRO) 10 MG tablet TAKE 1 TABLET BY MOUTH ONCE DAILY 90 tablet 3  . hydrOXYzine (ATARAX/VISTARIL) 25 MG tablet Take 1 tablet (25 mg total) by mouth 2 (two) times daily as needed. for anxiety 60 tablet 3  . LARIN 1/20 1-20 MG-MCG tablet   4  . pantoprazole (PROTONIX) 20 MG tablet Take 1 tablet (20 mg total) by mouth daily as needed. 30 tablet 5   No current facility-administered medications for this visit.      Objective:  Ht 5' 6.5" (1.689 m)   Wt 159 lb (72.1 kg)   BMI 25.28 kg/m  self reported vitals Gen: NAD, resting comfortably Lungs: nonlabored, normal respiratory rate  Skin: appears dry, no obvious rash    Assessment and Plan   GERD S:PT states she was Rx'd protonix during her pregnancy and she has noticed over the summer that she has developed heartburn again and would like to get started back on Protonix. Happened first at the beach this summer and intermittent issues once a week since that time She is not currently pregnant and is consistent with her birth control.  pillCertain foods seem to bother her like spicy foods or alcohol about once a week. Had some leftover pills from pregnacy and issues went away . tums and  pepcid not effective- has tried both.   Symptoms are ongoing despite being down 45 pounds.  She is exercising regularly and is approaching her prepregnancy weight-congratulated her efforts A/P: Poor control without medication- has had good control with once weekly Protonix but may be 40 mg-we opted to try 20 mg dose.  She will let us know if that is not effective and we can fill a 40  mg dose for her.  Lab/Order associations:   ICD-10-CM   1. Gastroesophageal reflux disease without esophagitis  K21.9    Meds ordered this encounter  Medications  . pantoprazole (PROTONIX) 20 MG tablet    Sig: Take 1 tablet (20 mg total) by mouth daily as needed.    Dispense:  30 tablet    Refill:  5   Return precautions advised.  Tana Conch, MD

## 2019-10-06 ENCOUNTER — Encounter: Payer: Self-pay | Admitting: Family Medicine

## 2019-10-06 ENCOUNTER — Ambulatory Visit (INDEPENDENT_AMBULATORY_CARE_PROVIDER_SITE_OTHER): Payer: No Typology Code available for payment source | Admitting: Family Medicine

## 2019-10-06 DIAGNOSIS — K219 Gastro-esophageal reflux disease without esophagitis: Secondary | ICD-10-CM

## 2019-10-06 MED ORDER — PANTOPRAZOLE SODIUM 20 MG PO TBEC: 20.0000 mg | DELAYED_RELEASE_TABLET | Freq: Every day | ORAL | 5 refills | Status: DC | PRN

## 2019-10-06 MED FILL — PANTOPRAZOLE SOD DR 20 MG T: 20 | 30 days supply | Qty: 30 | Fill #0

## 2019-10-06 NOTE — Patient Instructions (Signed)
Health Maintenance Due  Topic Date Due  . TETANUS/TDAP  09/15/2018  . INFLUENZA VACCINE -09/26/2019 (work) 07/26/2019   Depression screen PHQ 2/9 06/11/2019  Decreased Interest 0  Down, Depressed, Hopeless 0  PHQ - 2 Score 0  Altered sleeping 0  Tired, decreased energy 0  Change in appetite 0  Feeling bad or failure about yourself  0  Trouble concentrating 0  Moving slowly or fidgety/restless 0  Suicidal thoughts 0  PHQ-9 Score 0  Difficult doing work/chores Not difficult at all  Some encounter information is confidential and restricted. Go to Review Flowsheets activity to see all data.

## 2019-10-10 MED FILL — NORETHIND-ETH ESTRAD 1-0.02: 1-20 | 84 days supply | Qty: 84 | Fill #1

## 2019-10-21 MED FILL — NORETHIND-ETH ESTRAD 1-0.02: 1-20 | 84 days supply | Qty: 84 | Fill #1

## 2019-11-30 MED FILL — ESCITALOPRAM 10 MG TABLET: 10 | 90 days supply | Qty: 90 | Fill #3

## 2019-12-29 MED FILL — hydrOXYzine HCL 25 MG TABS: 25 | 30 days supply | Qty: 60 | Fill #1

## 2019-12-30 MED FILL — BIMATOPROST 0.03% EYELASH S: 0.03 | 37 days supply | Qty: 5 | Fill #0

## 2020-01-07 MED FILL — NORETHIND-ETH ESTRAD 1-0.02: 1-20 | 84 days supply | Qty: 84 | Fill #0

## 2020-01-29 ENCOUNTER — Other Ambulatory Visit: Payer: Self-pay | Admitting: Obstetrics and Gynecology

## 2020-01-29 DIAGNOSIS — Z1231 Encounter for screening mammogram for malignant neoplasm of breast: Secondary | ICD-10-CM

## 2020-02-16 ENCOUNTER — Other Ambulatory Visit: Payer: Self-pay

## 2020-02-16 ENCOUNTER — Encounter: Payer: Self-pay | Admitting: Family Medicine

## 2020-02-16 ENCOUNTER — Ambulatory Visit (INDEPENDENT_AMBULATORY_CARE_PROVIDER_SITE_OTHER): Payer: No Typology Code available for payment source | Admitting: Family Medicine

## 2020-02-16 VITALS — BP 98/70 | HR 86 | Temp 97.7°F | Ht 66.5 in | Wt 167.8 lb

## 2020-02-16 DIAGNOSIS — H7402 Tympanosclerosis, left ear: Secondary | ICD-10-CM | POA: Diagnosis not present

## 2020-02-16 MED FILL — ALPRAZolam 0.25 MG TABS: 0.25 | 30 days supply | Qty: 40 | Fill #1

## 2020-02-16 MED FILL — PANTOPRAZOLE SOD DR 20 MG T: 20 | 30 days supply | Qty: 30 | Fill #2

## 2020-02-16 NOTE — Patient Instructions (Addendum)
Health Maintenance Due  Topic Date Due  . TETANUS/TDAP -2016 09/15/2018  . INFLUENZA VACCINE -09/09/2019 07/26/2019   I did not see a cotton ball in the ear fortunately. Lucky you on your birthday! There is some scarring from prior infections. No workup needed unless hearing issues/ear pain.   No q tips down in the ear in the future  Mineral oil for ear full of wax Purchase mineral oil from laxative aisle Lay down on your side with ear that is bothering you facing up Use 3-4 drops with a dropper and place in ear for 30 seconds Place cotton swab outside of ear Turn to other side and allow this to drain Repeat 3-4 x a day Return to see [place] if not improving within a few days   Hope you have a great rest of your birthday!

## 2020-02-16 NOTE — Progress Notes (Signed)
Phone [phone] In person visit   Subjective:   Caitlin Christensen is a 39 y.o. year old very pleasant female patient who presents for/with See problem oriented charting Chief Complaint  Patient presents with  . Follow-up  . cotton stuck in ear    This visit occurred during the SARS-CoV-2 public health emergency.  Safety protocols were in place, including screening questions prior to the visit, additional usage of staff PPE, and extensive cleaning of exam room while observing appropriate contact time as indicated for disinfecting solutions.   Past Medical History-  Patient Active Problem List   Diagnosis Date Noted  . GERD (gastroesophageal reflux disease) 10/06/2019    Priority: Medium  . Anxiety 06/20/2012    Priority: Medium  . Nonallopathic lesion of sacral region 08/12/2018    Priority: Low  . Nonallopathic lesion of rib cage 08/12/2018    Priority: Low  . Floaters 12/15/2015    Priority: Low  . Low back pain 06/20/2012    Priority: Low  . Nonallopathic lesion of cervical region 05/19/2011    Priority: Low  . Nonallopathic lesion of thoracic region 05/19/2011    Priority: Low  . Nonallopathic lesion of lumbosacral region 05/19/2011    Priority: Low  . 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 ALPRAZolam (XANAX) 0.25 MG tablet Take 1 tablet (0.25 mg total) by mouth 2 (two) times daily as needed for anxiety or sleep. 1 refill per month 40 tablet 1  . escitalopram (LEXAPRO) 10 MG tablet TAKE 1 TABLET BY MOUTH ONCE DAILY 90 tablet 3  . hydrOXYzine (ATARAX/VISTARIL) 25 MG tablet Take 1 tablet (25 mg total) by mouth 2 (two) times daily as needed. for anxiety 60 tablet 3  . LARIN 1/20 1-20 MG-MCG tablet   4  . pantoprazole (PROTONIX) 20 MG tablet Take 1 tablet (20 mg total) by mouth daily as needed. 30 tablet 5   No current facility-administered  medications for this visit.     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 She states when she pulled out the q-tip the cotton wasn't on there.  Patient reports that when she was younger she had 2 sets of tubes in her left ear A/P: On examination there is no cotton within the left ear.  In fact I see almost no wax or debris within the ear canal.  She does have some scarring on the left tympanic membrane/tympanosclerosis likely from prior ear infection/tubes.  We discussed this diagnosis.  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   Return precautions advised.  Tana Conch, MD

## 2020-03-01 ENCOUNTER — Other Ambulatory Visit: Payer: Self-pay | Admitting: Family Medicine

## 2020-03-01 MED FILL — ESCITALOPRAM 10 MG TABLET: 10 | 90 days supply | Qty: 90 | Fill #0

## 2020-03-12 ENCOUNTER — Other Ambulatory Visit: Payer: Self-pay

## 2020-03-12 ENCOUNTER — Ambulatory Visit
Admission: RE | Admit: 2020-03-12 | Discharge: 2020-03-12 | Disposition: A | Discharge status: Home / Self Care | Payer: No Typology Code available for payment source | Source: Ambulatory Visit | Attending: Obstetrics and Gynecology | Admitting: Obstetrics and Gynecology

## 2020-03-12 DIAGNOSIS — Z1231 Encounter for screening mammogram for malignant neoplasm of breast: Secondary | ICD-10-CM

## 2020-03-24 MED FILL — hydrOXYzine HCL 25 MG TABS: 25 | 30 days supply | Qty: 60 | Fill #2

## 2020-05-17 MED FILL — NORETHIND-ETH ESTRAD 1-0.02: 1-20 | 84 days supply | Qty: 84 | Fill #0

## 2020-06-14 MED FILL — PANTOPRAZOLE SOD DR 20 MG T: 20 | 30 days supply | Qty: 30 | Fill #3

## 2020-06-14 MED FILL — ESCITALOPRAM 10 MG TABLET: 10 | 90 days supply | Qty: 90 | Fill #1

## 2020-07-30 NOTE — Patient Instructions (Addendum)
Please stop by lab before you go If you have mychart- we will send your results within 3 business days of [place] receiving them.  If you do not have mychart- we will call you about results within 5 business days of [place] receiving them.  *please note we are currently using [facility] which has a longer processing time than [place] typically so labs may not come back as quickly as in the past *please also note that you will see labs on mychart as soon as they post. I will later go in and write notes on them- will say "notes from Dr. Durene Cal"   discussed with excellent control on omeprazole could try pepcid OTC as well   Send [place] a message when you get your fall flu shot  Sign release of information at the check out desk for last pap smear from [facility]   I think allergies could contribute to symptoms- I want her to try off the sudafed and try allegra or claritin or zyrtec or xyzal continue flonase in morning. Also add neti pot before bed or neil med sinus rinse. If no better in 1 month- consider astelin spray add on by mychart. If no better after that- can refer to ENT or if she prefers ENT at 1 month can do that. I also want to see hearing improve and if not get ENT opinion.

## 2020-07-30 NOTE — Progress Notes (Signed)
Phone [phone]   Subjective:  Patient presents today for their annual physical. Chief complaint-noted.   See problem oriented charting- Review of Systems  Constitutional: Negative.   HENT: Positive for congestion, hearing loss and sinus pain.   Eyes: Negative.   Respiratory: Negative.   Cardiovascular: Negative.   Gastrointestinal: Negative.   Genitourinary: Negative.   Musculoskeletal: Negative.   Skin: Negative.   Neurological: Negative.   Endo/Heme/Allergies: Negative.   Psychiatric/Behavioral: Negative.     The following were reviewed and entered/updated in epic: Past Medical History:  Diagnosis Date  . GERD (gastroesophageal reflux disease) 2016   with pregnancy only  . 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 GERD (gastroesophageal reflux disease) 10/06/2019    Priority: Medium  . Anxiety 06/20/2012    Priority: Medium  . Nonallopathic lesion of sacral region 08/12/2018    Priority: Low  . Nonallopathic lesion of rib cage 08/12/2018    Priority: Low  . Floaters 12/15/2015    Priority: Low  . Low back pain 06/20/2012    Priority: Low  . Nonallopathic lesion of cervical region 05/19/2011    Priority: Low  . Nonallopathic lesion of thoracic region 05/19/2011    Priority: Low  . Nonallopathic lesion of lumbosacral region 05/19/2011    Priority: Low  . 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 BREAST BIOPSY Right 02/05/2019   PASH  . CESAREAN SECTION N/A 06/25/2015   Procedure: CESAREAN SECTION;  Surgeon: Everlene Farrier, MD;  Location: [facility];  Service: Obstetrics;  Laterality: N/A;  primary   . HERNIA REPAIR     inguinal as baby  . LIPOSUCTION MULTIPLE BODY PARTS     Stomach and hips  . typanostomy tubes    . WISDOM TOOTH EXTRACTION  1999    Family History  Problem Relation Age of Onset  . Arthritis  Father   . Hearing loss Father   . Retinal detachment Mother   . Stroke Maternal Grandmother   . Diabetes Neg Hx   . Drug abuse Neg Hx   . Early death Neg Hx   . Heart disease Neg Hx   . Hyperlipidemia Neg Hx   . Hypertension Neg Hx   . Kidney disease Neg Hx   . Cancer Neg Hx     Medications- reviewed and updated Current Outpatient Medications  Medication Sig Dispense Refill  . ALPRAZolam (XANAX) 0.25 MG tablet Take 1 tablet (0.25 mg total) by mouth 2 (two) times daily as needed for anxiety or sleep. 1 refill per month 40 tablet 1  . escitalopram (LEXAPRO) 10 MG tablet TAKE 1 TABLET BY MOUTH ONCE DAILY 90 tablet 3  . hydrOXYzine (ATARAX/VISTARIL) 25 MG tablet Take 1 tablet (25 mg total) by mouth 2 (two) times daily as needed. for anxiety 60 tablet 3  . LARIN 1/20 1-20 MG-MCG tablet   4  . pantoprazole (PROTONIX) 20 MG tablet Take 1 tablet (20 mg total) by mouth daily as needed. 30 tablet 5   No current facility-administered medications for this visit.    Allergies-reviewed and updated Allergies  Allergen Reactions  . Seasonique [Levonorgestrel-Ethinyl Estrad] Hives    Social History   Social History Narrative   Married to [place]. Daughter Terrence Dupont born 06/25/2015.  Has a dog.       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 Enjoys being active- working to get last 10 lbs of baby weight, outdoor activities, hiking   Objective  Objective:  BP 100/76   Pulse 75   Temp 97.7 F (36.5 C)   Ht 5' 6.5" (1.689 m)   Wt 159 lb 6.4 oz (72.3 kg)   SpO2 98%   BMI 25.34 kg/m  Gen: NAD, resting comfortably HEENT: Mucous membranes are moist. Oropharynx normal- mild erythema in pharynx.  Neck: no thyromegaly CV: RRR no murmurs rubs or gallops Lungs: CTAB no crackles, wheeze, rhonchi Abdomen: soft/nontender/nondistended/normal bowel sounds. No rebound or guarding.  Ext: no edema Skin: warm,  dry Neuro: grossly normal, moves all extremities, PERRLA   Assessment and Plan   39 y.o. female presenting for annual physical.  Health Maintenance counseling: 1. Anticipatory guidance: Patient counseled regarding regular dental exams yes q6 months, eye exams-not recently- rare small floaters but better ,  avoiding smoking and second hand smoke, limiting alcohol to 1 beverage per day-she does not drink regularly- just socially 2. Risk factor reduction:  Advised patient of need for regular exercise and diet rich and fruits and vegetables to reduce risk of heart attack and stroke. Exercise- walk twice a week. Diet-congratulated patient on being down 8 pounds from last year. Did a visit with bariatric clinic- phentermine short term and almost down 10 lbs on home scales. Off phentermine now Wt Readings from Last 3 Encounters:  08/03/20 159 lb 6.4 oz (72.3 kg)  02/16/20 167 lb 12.8 oz (76.1 kg)  10/06/19 159 lb (72.1 kg)  3. Immunizations/screenings/ancillary studies-one-time lifetime hepatitis C screening discussed and opts in.  Recommended flu shot in the fall Immunization History  Administered Date(s) Administered  . Influenza Whole 10/03/2012  . Influenza,inj,Quad PF,6+ Mos 09/09/2019  . Influenza-Unspecified 09/25/2015, 09/25/2016, 09/17/2017  . MMR 06/27/2015  . PFIZER SARS-COV-2 Vaccination 12/20/2019, 01/10/2020  . Tdap 09/15/2008, 12/06/2015   4. Cervical cancer screening- with [facility]-also had 01/13/2019 and she reports this was normal as well sa feb 2021 5. Breast cancer screening-  breast exam with gynecology and mammogram yearly due to increased breast cancer lifetime risk based on genetic testing at 28%- only recs in this range are yearly mammogram- would do breast MRI if had first degree relative with breast cancer.  Has had a biopsy in the past which was benign thankfully.   6. Colon cancer screening -  no family history, start at age 67  7. Skin cancer  screening-follows with Dr. Delman Cheadle as needed- saw in January. advised regular sunscreen use. Denies worrisome, changing, or new skin lesions.  8. Birth control/STD check- monogamous and on birth control 87. Osteoporosis screening at 65-we will plan on this -Never smoker  Status of chronic or acute concerns   # congestion/sinus issues S:6-9 months of issues- feels like has a harder time hearing as well.  Sudafed regularly as well as flonase. Mainly clear A/P: I think allergies could contribute to symptoms- I want her to try off the sudafed and try allegra or claritin or zyrtec or xyzal continue flonase in morning. Also add neti pot before bed or neil med sinus rinse. If no better in 1 month- consider astelin spray add on by mychart. If no better after that- can refer to ENT or if she prefers ENT at 1 month can do that. I also want to see hearing improve and if not get ENT opinion.    #  GERD S:Patient is tolerating omeprazole 20 mg down from 40 mg. Using 2 days a week such as if knows she will be eating poorly.  B12 levels related to PPI use: No results found for: VITAMINB12 A/P: discussed with excellent control on omeprazole could try pepcid OTC as well   # Anxiety S:Medication: Lexapro 10 mg and hydroxyzine as needed. Feels like really helping. Xanax helpful if has more significant anxiety but uses sparingly - sometimes for sleep. No driving for 8 hours after taking A/P: reasonable control- continue current medications   #hyperlipidemia S: Medication:none  Mom and grandparents on statins   Lab Results  Component Value Date   CHOL 209 (H) 06/11/2019   HDL 54.10 06/11/2019   LDLCALC 122 (H) 06/11/2019   TRIG 165.0 (H) 06/11/2019   CHOLHDL 4 06/11/2019   A/P: will trend with labs- not in range I would start statin.   Recommended follow up: Return in about 1 year (around 08/03/2021) for physical or sooner if needed.   Lab/Order associations: fasting   ICD-10-CM   1. Gastroesophageal  reflux disease without esophagitis  K21.9   2. Anxiety  F41.9   3. Hyperlipidemia, unspecified hyperlipidemia type  E78.5 CBC with Differential/Platelet    Comprehensive metabolic panel    Lipid panel    TSH  4. Preventative health care  Z00.00 CBC with Differential/Platelet    Comprehensive metabolic panel    Lipid panel    TSH    Hepatitis C antibody  5. Encounter for hepatitis C screening test for low risk patient  Z11.59 Hepatitis C antibody    No orders of the defined types were placed in this encounter.   Return precautions advised.  Garret Reddish, MD

## 2020-08-03 ENCOUNTER — Encounter: Payer: Self-pay | Admitting: Family Medicine

## 2020-08-03 ENCOUNTER — Other Ambulatory Visit: Payer: Self-pay

## 2020-08-03 ENCOUNTER — Ambulatory Visit (INDEPENDENT_AMBULATORY_CARE_PROVIDER_SITE_OTHER): Payer: No Typology Code available for payment source | Admitting: Family Medicine

## 2020-08-03 ENCOUNTER — Other Ambulatory Visit: Payer: Self-pay | Admitting: Family Medicine

## 2020-08-03 VITALS — BP 100/76 | HR 75 | Temp 97.7°F | Ht 66.5 in | Wt 159.4 lb

## 2020-08-03 DIAGNOSIS — F419 Anxiety disorder, unspecified: Secondary | ICD-10-CM

## 2020-08-03 DIAGNOSIS — Z0000 Encounter for general adult medical examination without abnormal findings: Secondary | ICD-10-CM

## 2020-08-03 DIAGNOSIS — Z1159 Encounter for screening for other viral diseases: Secondary | ICD-10-CM

## 2020-08-03 DIAGNOSIS — K219 Gastro-esophageal reflux disease without esophagitis: Secondary | ICD-10-CM

## 2020-08-03 DIAGNOSIS — E785 Hyperlipidemia, unspecified: Secondary | ICD-10-CM

## 2020-08-03 DIAGNOSIS — Z79899 Other long term (current) drug therapy: Secondary | ICD-10-CM

## 2020-08-03 MED ORDER — HYDROXYZINE HCL 25 MG PO TABS: 25.0000 mg | ORAL_TABLET | Freq: Two times a day (BID) | ORAL | 3 refills | Status: DC | PRN

## 2020-08-03 MED ORDER — ESCITALOPRAM OXALATE 10 MG PO TABS: 10.0000 mg | ORAL_TABLET | Freq: Every day | ORAL | 3 refills | Status: DC

## 2020-08-03 MED ORDER — ALPRAZOLAM 0.25 MG PO TABS: 0.2500 mg | ORAL_TABLET | Freq: Two times a day (BID) | ORAL | 1 refills | Status: DC | PRN

## 2020-08-03 MED ORDER — PANTOPRAZOLE SODIUM 20 MG PO TBEC: 20.0000 mg | DELAYED_RELEASE_TABLET | Freq: Every day | ORAL | 5 refills | Status: DC | PRN

## 2020-08-03 NOTE — Addendum Note (Signed)
Addended by: Doreen Salvage on: 08/03/2020 09:21 AM   Modules accepted: Orders

## 2020-08-04 LAB — COMPREHENSIVE METABOLIC PANEL
AG Ratio: 1.4 (calc) (ref 1.0–2.5)
ALT: 13 U/L (ref 6–29)
AST: 14 U/L (ref 10–30)
Albumin: 4.2 g/dL (ref 3.6–5.1)
Alkaline phosphatase (APISO): 35 U/L (ref 31–125)
BUN: 12 mg/dL (ref 7–25)
CO2: 23 mmol/L (ref 20–32)
Calcium: 9 mg/dL (ref 8.6–10.2)
Chloride: 106 mmol/L (ref 98–110)
Creat: 0.75 mg/dL (ref 0.50–1.10)
Globulin: 3 g/dL (calc) (ref 1.9–3.7)
Glucose, Bld: 78 mg/dL (ref 65–99)
Potassium: 4.5 mmol/L (ref 3.5–5.3)
Sodium: 137 mmol/L (ref 135–146)
Total Bilirubin: 0.4 mg/dL (ref 0.2–1.2)
Total Protein: 7.2 g/dL (ref 6.1–8.1)

## 2020-08-04 LAB — CBC WITH DIFFERENTIAL/PLATELET
Absolute Monocytes: 392 cells/uL (ref 200–950)
Basophils Absolute: 39 cells/uL (ref 0–200)
Basophils Relative: 0.7 %
Eosinophils Absolute: 78 cells/uL (ref 15–500)
Eosinophils Relative: 1.4 %
HCT: 41.3 % (ref 35.0–45.0)
Hemoglobin: 13.6 g/dL (ref 11.7–15.5)
Lymphs Abs: 2044 cells/uL (ref 850–3900)
MCH: 30.9 pg (ref 27.0–33.0)
MCHC: 32.9 g/dL (ref 32.0–36.0)
MCV: 93.9 fL (ref 80.0–100.0)
MPV: 9.2 fL (ref 7.5–12.5)
Monocytes Relative: 7 %
Neutro Abs: 3046 cells/uL (ref 1500–7800)
Neutrophils Relative %: 54.4 %
Platelets: 311 10*3/uL (ref 140–400)
RBC: 4.4 10*6/uL (ref 3.80–5.10)
RDW: 11.7 % (ref 11.0–15.0)
Total Lymphocyte: 36.5 %
WBC: 5.6 10*3/uL (ref 3.8–10.8)

## 2020-08-04 LAB — LIPID PANEL
Cholesterol: 203 mg/dL — ABNORMAL HIGH (ref <200)
HDL: 53 mg/dL (ref >=50)
LDL Cholesterol (Calc): 125 mg/dL (calc) — ABNORMAL HIGH
Non-HDL Cholesterol (Calc): 150 mg/dL (calc) — ABNORMAL HIGH (ref <130)
Total CHOL/HDL Ratio: 3.8 (calc) (ref <5.0)
Triglycerides: 131 mg/dL (ref <150)

## 2020-08-04 LAB — HEPATITIS C ANTIBODY
Hepatitis C Ab: NONREACTIVE
SIGNAL TO CUT-OFF: 0.01 (ref <1.00)

## 2020-08-04 LAB — VITAMIN B12: Vitamin B-12: 396 pg/mL (ref 200–1100)

## 2020-08-04 MED FILL — LARIN 21 1-20 TABLET: 1-20 | 84 days supply | Qty: 84 | Fill #0

## 2020-08-04 MED FILL — PANTOPRAZOLE SOD DR 20 MG T: 20 | 30 days supply | Qty: 30 | Fill #0

## 2020-08-04 MED FILL — hydrOXYzine HCL 25 MG TABS: 25 | 30 days supply | Qty: 60 | Fill #0

## 2020-08-04 MED FILL — ALPRAZolam 0.25 MG TABS: 0.25 | 30 days supply | Qty: 40 | Fill #0

## 2020-09-22 MED FILL — ESCITALOPRAM 10 MG TABLET: 10 | 90 days supply | Qty: 90 | Fill #0

## 2020-11-22 ENCOUNTER — Telehealth: Payer: Self-pay

## 2020-11-22 NOTE — Telephone Encounter (Signed)
..  Please look into the following billing question below:   Has patient reached out to billing?    NO  If yes, what did billing advise the patient?    Is the patient calling in regard to a [employer] or [employer]?   [facility] Bill   Pt received a bill from [facility] for the [facility] for over $500. Pt called her insurance and was told we used an out of network lab company. Pt states she has not had any bills her past few physicals for lab work.     Patient Name: MRN: [place] ID:   DOB: Date of Service:  08/03/20 Amount:    Additional Comments:

## 2020-11-25 NOTE — Telephone Encounter (Signed)
Sent email to [place] with [employer].

## 2020-11-29 MED FILL — hydrOXYzine HCL 25 MG TABS: 25 | 30 days supply | Qty: 60 | Fill #1

## 2020-11-29 MED FILL — ALPRAZolam 0.25 MG TABS: 0.25 | 30 days supply | Qty: 40 | Fill #1

## 2020-11-29 MED FILL — LARIN 21 1-20 TABLET: 1-20 | 84 days supply | Qty: 84 | Fill #1

## 2020-12-09 NOTE — Telephone Encounter (Signed)
Patient is following up about this. Said she still has not received a phone call

## 2020-12-10 NOTE — Telephone Encounter (Signed)
I have sent another email back to vicki in regard and have spoken with patient.

## 2020-12-22 MED FILL — BIMATOPROST 0.03% EYELASH S: 0.03 | 37 days supply | Qty: 5 | Fill #1

## 2020-12-27 MED FILL — ESCITALOPRAM 10 MG TABLET: 10 | 90 days supply | Qty: 90 | Fill #1

## 2021-01-04 NOTE — Telephone Encounter (Signed)
I have sent another email in regard to [facility].

## 2021-01-13 NOTE — Telephone Encounter (Signed)
Pt is following up on this issue. I told her we are reaching out to [facility]. She just received another bill that says "final bill".

## 2021-01-21 NOTE — Telephone Encounter (Signed)
I have left patient vm that this has been resubmitted through the billing department with [facility].

## 2021-02-25 ENCOUNTER — Other Ambulatory Visit: Payer: Self-pay | Admitting: Family Medicine

## 2021-02-25 ENCOUNTER — Other Ambulatory Visit (HOSPITAL_COMMUNITY): Payer: Self-pay | Admitting: Obstetrics and Gynecology

## 2021-02-25 MED FILL — ALPRAZolam 0.25 MG TABS: 0.25 | 30 days supply | Qty: 40 | Fill #0

## 2021-02-25 MED FILL — LARIN 21 1-20 TABLET: 1-20 | 84 days supply | Qty: 84 | Fill #0

## 2021-03-02 ENCOUNTER — Other Ambulatory Visit: Payer: Self-pay | Admitting: Obstetrics and Gynecology

## 2021-03-02 DIAGNOSIS — Z1231 Encounter for screening mammogram for malignant neoplasm of breast: Secondary | ICD-10-CM

## 2021-03-02 LAB — HM PAP SMEAR

## 2021-03-04 ENCOUNTER — Other Ambulatory Visit: Payer: Self-pay | Admitting: Obstetrics and Gynecology

## 2021-03-04 DIAGNOSIS — N6320 Unspecified lump in the left breast, unspecified quadrant: Secondary | ICD-10-CM

## 2021-03-08 ENCOUNTER — Telehealth: Payer: Self-pay

## 2021-03-08 NOTE — Telephone Encounter (Signed)
May refer to optometry under floaters and list patient needs to see one of the following 3 optometrist offices

## 2021-03-08 NOTE — Telephone Encounter (Signed)
Please advise 

## 2021-03-08 NOTE — Telephone Encounter (Signed)
Pt is requesting a referral to see an eye dr for floaters that she is seeing.Pt states this happened to her 5 years ago, as well. Her insurance states she needs a referral. Below is a list of eye centers in network with her insurance  [facility]

## 2021-03-09 ENCOUNTER — Other Ambulatory Visit: Payer: Self-pay

## 2021-03-09 DIAGNOSIS — H43399 Other vitreous opacities, unspecified eye: Secondary | ICD-10-CM

## 2021-03-09 NOTE — Telephone Encounter (Signed)
Referral has been placed and patient is aware. 

## 2021-03-26 ENCOUNTER — Other Ambulatory Visit (HOSPITAL_COMMUNITY): Payer: Self-pay

## 2021-03-26 ENCOUNTER — Other Ambulatory Visit: Payer: Self-pay | Admitting: Family Medicine

## 2021-03-28 ENCOUNTER — Other Ambulatory Visit (HOSPITAL_COMMUNITY): Payer: Self-pay

## 2021-03-28 MED ORDER — ESCITALOPRAM OXALATE 10 MG PO TABS
10.0000 mg | ORAL_TABLET | Freq: Every day | ORAL | 1 refills | Status: DC
  Filled 2021-03-28: qty 90, 90d supply, fill #0
  Filled 2021-03-28: qty 30, 30d supply, fill #0
  Filled 2021-06-29: qty 90, 90d supply, fill #1

## 2021-03-30 ENCOUNTER — Other Ambulatory Visit (HOSPITAL_COMMUNITY): Payer: Self-pay

## 2021-03-30 MED FILL — Hydroxyzine HCl Tab 25 MG: ORAL | 30 days supply | Qty: 60 | Fill #0 | Status: AC

## 2021-04-19 ENCOUNTER — Other Ambulatory Visit: Payer: Self-pay

## 2021-04-19 ENCOUNTER — Ambulatory Visit
Admission: RE | Admit: 2021-04-19 | Discharge: 2021-04-19 | Disposition: A | Discharge status: Home / Self Care | Payer: No Typology Code available for payment source | Source: Ambulatory Visit | Attending: Obstetrics and Gynecology | Admitting: Obstetrics and Gynecology

## 2021-04-19 DIAGNOSIS — N6320 Unspecified lump in the left breast, unspecified quadrant: Secondary | ICD-10-CM

## 2021-04-25 ENCOUNTER — Other Ambulatory Visit (HOSPITAL_COMMUNITY): Payer: Self-pay

## 2021-04-26 ENCOUNTER — Ambulatory Visit: Payer: No Typology Code available for payment source

## 2021-05-03 ENCOUNTER — Other Ambulatory Visit (HOSPITAL_COMMUNITY): Payer: Self-pay

## 2021-05-16 ENCOUNTER — Other Ambulatory Visit (HOSPITAL_COMMUNITY): Payer: Self-pay

## 2021-05-16 ENCOUNTER — Telehealth: Payer: Self-pay

## 2021-05-16 MED ORDER — PANTOPRAZOLE SODIUM 20 MG PO TBEC
20.0000 mg | DELAYED_RELEASE_TABLET | Freq: Every day | ORAL | 5 refills | Status: DC | PRN
  Filled 2021-05-16: qty 30, 30d supply, fill #0
  Filled 2021-06-12: qty 30, 30d supply, fill #1
  Filled 2021-07-12: qty 30, 30d supply, fill #2
  Filled 2021-08-08: qty 19, 19d supply, fill #3
  Filled 2021-08-08: qty 11, 11d supply, fill #3
  Filled 2021-09-08: qty 30, 30d supply, fill #4
  Filled 2021-10-06: qty 30, 30d supply, fill #5

## 2021-05-16 NOTE — Telephone Encounter (Signed)
Refill sent to pharmacy.   

## 2021-05-16 NOTE — Telephone Encounter (Signed)
  LAST APPOINTMENT DATE: 08/03/2020   NEXT APPOINTMENT DATE:@8 /10/2021  MEDICATION:pantoprazole (PROTONIX) 20 MG tablet  PHARMACY: Outpatient Pharmacy  Comments: Patient states pharmacy has sent several faxes for a refill over the last couple of weeks and has had no response.  Please advise

## 2021-06-04 ENCOUNTER — Other Ambulatory Visit: Payer: Self-pay

## 2021-06-06 ENCOUNTER — Other Ambulatory Visit (HOSPITAL_COMMUNITY): Payer: Self-pay

## 2021-06-06 MED ORDER — NORETHINDRONE ACET-ETHINYL EST 1-20 MG-MCG PO TABS
1.0000 | ORAL_TABLET | Freq: Every day | ORAL | 2 refills | Status: DC
  Filled 2021-06-06: qty 84, 84d supply, fill #0
  Filled 2021-08-15: qty 84, 84d supply, fill #1
  Filled 2021-11-13: qty 84, 84d supply, fill #2

## 2021-06-12 MED FILL — Alprazolam Tab 0.25 MG: ORAL | 30 days supply | Qty: 40 | Fill #0 | Status: AC

## 2021-06-12 MED FILL — Hydroxyzine HCl Tab 25 MG: ORAL | 30 days supply | Qty: 60 | Fill #1 | Status: AC

## 2021-06-13 ENCOUNTER — Other Ambulatory Visit (HOSPITAL_COMMUNITY): Payer: Self-pay

## 2021-06-30 ENCOUNTER — Other Ambulatory Visit (HOSPITAL_COMMUNITY): Payer: Self-pay

## 2021-07-12 ENCOUNTER — Other Ambulatory Visit (HOSPITAL_COMMUNITY): Payer: Self-pay

## 2021-08-04 ENCOUNTER — Encounter: Payer: No Typology Code available for payment source | Admitting: Family Medicine

## 2021-08-08 ENCOUNTER — Other Ambulatory Visit (HOSPITAL_COMMUNITY): Payer: Self-pay

## 2021-08-16 ENCOUNTER — Other Ambulatory Visit (HOSPITAL_COMMUNITY): Payer: Self-pay

## 2021-08-16 ENCOUNTER — Telehealth (INDEPENDENT_AMBULATORY_CARE_PROVIDER_SITE_OTHER): Payer: No Typology Code available for payment source | Admitting: Family Medicine

## 2021-08-16 DIAGNOSIS — U071 COVID-19: Secondary | ICD-10-CM | POA: Diagnosis not present

## 2021-08-16 DIAGNOSIS — R059 Cough, unspecified: Secondary | ICD-10-CM | POA: Diagnosis not present

## 2021-08-16 MED ORDER — ALBUTEROL SULFATE HFA 108 (90 BASE) MCG/ACT IN AERS
2.0000 | INHALATION_SPRAY | Freq: Four times a day (QID) | RESPIRATORY_TRACT | 0 refills | Status: DC | PRN
  Filled 2021-08-16: qty 8.5, 25d supply, fill #0

## 2021-08-16 MED ORDER — DOXYCYCLINE HYCLATE 100 MG PO TABS
100.0000 mg | ORAL_TABLET | Freq: Two times a day (BID) | ORAL | 0 refills | Status: DC
  Filled 2021-08-16: qty 14, 7d supply, fill #0

## 2021-08-16 NOTE — Progress Notes (Signed)
Virtual Visit via Video Note  I connected with Caitlin Christensen  on 08/16/21 at 10:00 AM EDT by a video enabled telemedicine application and verified that I am speaking with the correct person using two identifiers.  Location patient: work, [place] Location provider:work or home office Persons participating in the virtual visit: patient, provider  I discussed the limitations of evaluation and management by telemedicine and the availability of in person appointments. The patient expressed understanding and agreed to proceed.   HPI:  Acute telemedicine visit for cough: -Onset: about 3 weeks ago with Covid (tested positive about a month ago) -Symptoms include: still has an ongoing cough and congestion that is yellow, ongoing fatigue (back at work), still short of breath at times -O2 over 96% on RA per pt at home and work -daughter was sick too and pediatrician thought she had pneumonia and treated with abx and inhaler and she got better -Denies: fevers, chest pain, NVD, inability to eat/drink/get out of bed -Has tried:ppi as her heart burn has been worse since having covid -Pertinent past medical history: see below -denies any chance of pregnancy -Pertinent medication allergies:  Allergies  Allergen Reactions   Seasonique [Levonorgestrel-Ethinyl Estrad] Hives  -COVID-19 vaccine status: had 2 doses and one booster  ROS: See pertinent positives and negatives per HPI.  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    Past Surgical History:  Procedure Laterality Date   BREAST BIOPSY Right 02/05/2019   PASH   CESAREAN SECTION N/A 06/25/2015   Procedure: CESAREAN SECTION;  Surgeon: Harold Hedge, MD;  Location: [facility];  Service: Obstetrics;  Laterality: N/A;  primary    HERNIA REPAIR     inguinal as baby   LIPOSUCTION MULTIPLE BODY PARTS     Stomach and hips   typanostomy  tubes     WISDOM TOOTH EXTRACTION  1999     Current Outpatient Medications:    albuterol (VENTOLIN HFA) 108 (90 Base) MCG/ACT inhaler, Inhale 2 puffs into the lungs every 6 (six) hours as needed for wheezing or shortness of breath., Disp: 8 g, Rfl: 0   ALPRAZolam (XANAX) 0.25 MG tablet, TAKE 1 TABLET (0.25 MG TOTAL) BY MOUTH 2 (TWO) TIMES DAILY AS NEEDED FOR ANXIETY OR SLEEP. 1 REFILL PER MONTH, Disp: 40 tablet, Rfl: 1   doxycycline (VIBRA-TABS) 100 MG tablet, Take 1 tablet (100 mg total) by mouth 2 (two) times daily., Disp: 14 tablet, Rfl: 0   escitalopram (LEXAPRO) 10 MG tablet, Take 1 tablet (10 mg total) by mouth daily., Disp: 90 tablet, Rfl: 3   escitalopram (LEXAPRO) 10 MG tablet, Take 1 tablet (10 mg total) by mouth daily., Disp: 90 tablet, Rfl: 1   LARIN 1/20 1-20 MG-MCG tablet, , Disp: , Rfl: 4   norethindrone-ethinyl estradiol (LOESTRIN) 1-20 MG-MCG tablet, TAKE 1 TABLET BY MOUTH ONCE DAILY CONTINUOUSLY, Disp: 84 tablet, Rfl: 0   norethindrone-ethinyl estradiol (LOESTRIN) 1-20 MG-MCG tablet, TAKE 1 TABLET BY MOUTH EVERY DAY CONTINUOUSLY, Disp: 84 tablet, Rfl: 2   pantoprazole (PROTONIX) 20 MG tablet, Take 1 tablet (20 mg total) by mouth daily as needed., Disp: 30 tablet, Rfl: 5  EXAM:  VITALS per patient if applicable: denies fever, reports O2 > 96%  GENERAL: alert, oriented, appears well and in no acute distress  HEENT: atraumatic, conjunttiva clear, no obvious abnormalities on inspection of external nose and ears  NECK: normal movements of the  head and neck  LUNGS: on inspection no signs of respiratory distress, breathing rate appears normal, no obvious gross SOB, gasping or wheezing  CV: no obvious cyanosis  MS: moves all visible extremities without noticeable abnormality  PSYCH/NEURO: pleasant and cooperative, no obvious depression or anxiety, speech and thought processing grossly intact  ASSESSMENT AND PLAN:  Discussed the following assessment and  plan:  COVID-19  Cough  -we discussed possible serious and likely etiologies, options for evaluation and workup, limitations of telemedicine visit vs in person visit, treatment, treatment risks and precautions. Pt prefers to treat via telemedicine empirically rather than in person at this moment. Query residual symptoms from covid, 2ndary bacterial resp illness, vs other. Discussed potential complications of covid and causes of dyspnea. Advised inperson evaluation would be best for heat and lung eval. She opted to try empiric tx with abx and prn alb first. Agrees to seek prompt inperson care if worsening, new symptoms arise, or if is not improving with treatment. Discussed options for inperson care if PCP office not available. Did let this patient know that I only do telemedicine on Tuesdays and Thursdays for [place]. Advised to schedule follow up visit with PCP or [facility] if any further questions or concerns to avoid delays in care.   I discussed the assessment and treatment plan with the patient. The patient was provided an opportunity to ask questions and all were answered. The patient agreed with the plan and demonstrated an understanding of the instructions.     Terressa Koyanagi, DO

## 2021-08-16 NOTE — Patient Instructions (Signed)
-  I sent the medication(s) we discussed to your pharmacy: Meds ordered this encounter  Medications   doxycycline (VIBRA-TABS) 100 MG tablet    Sig: Take 1 tablet (100 mg total) by mouth 2 (two) times daily.    Dispense:  14 tablet    Refill:  0   albuterol (VENTOLIN HFA) 108 (90 Base) MCG/ACT inhaler    Sig: Inhale 2 puffs into the lungs every 6 (six) hours as needed for wheezing or shortness of breath.    Dispense:  8 g    Refill:  0     I hope you are feeling better soon!  Seek in person care promptly if your symptoms worsen, new concerns arise or you are not improving with treatment.  It was nice to meet you today. I help [place] [facility] out with telemedicine visits on Tuesdays and Thursdays and am available for visits on those days. If you have any concerns or questions following this visit please schedule a follow up visit with your Primary Care doctor or seek care at a local urgent care clinic to avoid delays in care.

## 2021-09-06 ENCOUNTER — Other Ambulatory Visit (HOSPITAL_COMMUNITY): Payer: Self-pay

## 2021-09-06 MED ORDER — TRETINOIN 0.025 % EX CREA
TOPICAL_CREAM | CUTANEOUS | 3 refills | Status: DC
  Filled 2021-09-06 – 2021-09-22 (×2): qty 45, 30d supply, fill #0

## 2021-09-08 ENCOUNTER — Other Ambulatory Visit (HOSPITAL_COMMUNITY): Payer: Self-pay

## 2021-09-22 ENCOUNTER — Other Ambulatory Visit (HOSPITAL_COMMUNITY): Payer: Self-pay

## 2021-09-26 ENCOUNTER — Other Ambulatory Visit: Payer: Self-pay | Admitting: Family Medicine

## 2021-09-27 ENCOUNTER — Other Ambulatory Visit (HOSPITAL_COMMUNITY): Payer: Self-pay

## 2021-09-27 ENCOUNTER — Other Ambulatory Visit: Payer: Self-pay | Admitting: Family Medicine

## 2021-09-27 MED ORDER — ESCITALOPRAM OXALATE 10 MG PO TABS
10.0000 mg | ORAL_TABLET | Freq: Every day | ORAL | 0 refills | Status: DC
  Filled 2021-09-27: qty 90, 90d supply, fill #0

## 2021-09-27 MED ORDER — HYDROXYZINE HCL 25 MG PO TABS
25.0000 mg | ORAL_TABLET | Freq: Two times a day (BID) | ORAL | 3 refills | Status: DC | PRN
  Filled 2021-09-27: qty 60, 30d supply, fill #0
  Filled 2022-03-06: qty 60, 30d supply, fill #1
  Filled 2022-07-15: qty 50, 25d supply, fill #2
  Filled 2022-07-17: qty 10, 5d supply, fill #2

## 2021-09-27 MED ORDER — ALPRAZOLAM 0.25 MG PO TABS
0.2500 mg | ORAL_TABLET | Freq: Two times a day (BID) | ORAL | 1 refills | Status: DC
  Filled 2021-09-27: qty 40, 30d supply, fill #0
  Filled 2022-01-30: qty 40, 30d supply, fill #1

## 2021-09-27 NOTE — Telephone Encounter (Signed)
Last refill: 02/25/21 #40,1 Last OV: 08/03/20 dx. CPE Next OV: 11/22/21

## 2021-10-06 ENCOUNTER — Other Ambulatory Visit (HOSPITAL_COMMUNITY): Payer: Self-pay

## 2021-11-03 ENCOUNTER — Other Ambulatory Visit (HOSPITAL_COMMUNITY): Payer: Self-pay

## 2021-11-03 ENCOUNTER — Other Ambulatory Visit: Payer: Self-pay | Admitting: Family Medicine

## 2021-11-03 MED ORDER — PANTOPRAZOLE SODIUM 20 MG PO TBEC
20.0000 mg | DELAYED_RELEASE_TABLET | Freq: Every day | ORAL | 5 refills | Status: DC | PRN
  Filled 2021-11-03: qty 30, 30d supply, fill #0
  Filled 2021-12-05: qty 30, 30d supply, fill #1

## 2021-11-14 ENCOUNTER — Other Ambulatory Visit (HOSPITAL_COMMUNITY): Payer: Self-pay

## 2021-11-22 ENCOUNTER — Encounter: Payer: No Typology Code available for payment source | Admitting: Family Medicine

## 2021-12-05 ENCOUNTER — Other Ambulatory Visit (HOSPITAL_COMMUNITY): Payer: Self-pay

## 2021-12-13 ENCOUNTER — Encounter: Payer: Self-pay | Admitting: Family Medicine

## 2021-12-13 ENCOUNTER — Other Ambulatory Visit (HOSPITAL_COMMUNITY): Payer: Self-pay

## 2021-12-13 ENCOUNTER — Ambulatory Visit (INDEPENDENT_AMBULATORY_CARE_PROVIDER_SITE_OTHER): Payer: No Typology Code available for payment source | Admitting: Family Medicine

## 2021-12-13 VITALS — BP 118/76 | HR 78 | Temp 97.8°F | Ht 66.0 in | Wt 173.0 lb

## 2021-12-13 DIAGNOSIS — K219 Gastro-esophageal reflux disease without esophagitis: Secondary | ICD-10-CM

## 2021-12-13 DIAGNOSIS — Z0000 Encounter for general adult medical examination without abnormal findings: Secondary | ICD-10-CM

## 2021-12-13 DIAGNOSIS — E785 Hyperlipidemia, unspecified: Secondary | ICD-10-CM

## 2021-12-13 DIAGNOSIS — F419 Anxiety disorder, unspecified: Secondary | ICD-10-CM | POA: Diagnosis not present

## 2021-12-13 MED ORDER — PANTOPRAZOLE SODIUM 20 MG PO TBEC
20.0000 mg | DELAYED_RELEASE_TABLET | Freq: Every day | ORAL | 3 refills | Status: DC | PRN
  Filled 2021-12-13 – 2022-01-05 (×2): qty 90, 90d supply, fill #0
  Filled 2022-03-31: qty 90, 90d supply, fill #1
  Filled 2022-06-29: qty 90, 90d supply, fill #2
  Filled 2022-09-27: qty 90, 90d supply, fill #3

## 2021-12-13 MED ORDER — ESCITALOPRAM OXALATE 10 MG PO TABS
10.0000 mg | ORAL_TABLET | Freq: Every day | ORAL | 3 refills | Status: DC
  Filled 2021-12-13: qty 90, 90d supply, fill #0
  Filled 2022-04-18: qty 90, 90d supply, fill #1
  Filled 2022-07-15: qty 90, 90d supply, fill #2
  Filled 2022-10-23: qty 90, 90d supply, fill #3

## 2021-12-13 NOTE — Patient Instructions (Addendum)
Health Maintenance Due  Topic Date Due   COVID-19 Vaccine (3 - Pfizer risk series) - Patient wants to hold off for now.  02/07/2020   PAP SMEAR-Modifier  - Sign release of information at the check out desk for last PAP [facility] records.  01/08/2021   No labs today - we can have these done next year.  Please call [phone] to schedule a visit with [facility] - please tell the office you were directly referred by Dr. Durene Cal -or you can seek other option available through your insurance -for now continue lexapro  In regards to weight loss, you can try the "MyFitnessPal" mobile app, half-plate method, or calorie counting to help assist you or other option like intermittent fasting. I think you are doing great despite all that you have going on! Goal is to maintain over the holidays.  Recommended follow up: Return in about 1 year (around 12/13/2022) for physical or sooner if needed.
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
## 2022-01-03 DIAGNOSIS — Z719 Counseling, unspecified: Secondary | ICD-10-CM

## 2022-01-05 ENCOUNTER — Other Ambulatory Visit (HOSPITAL_COMMUNITY): Payer: Self-pay

## 2022-01-06 ENCOUNTER — Ambulatory Visit (INDEPENDENT_AMBULATORY_CARE_PROVIDER_SITE_OTHER): Payer: Self-pay | Admitting: Surgical

## 2022-01-06 ENCOUNTER — Other Ambulatory Visit: Payer: Self-pay

## 2022-01-06 ENCOUNTER — Encounter: Payer: Self-pay | Admitting: Surgical

## 2022-01-06 VITALS — BP 125/88 | HR 91 | Ht 66.5 in | Wt 170.0 lb

## 2022-01-06 DIAGNOSIS — Z411 Encounter for cosmetic surgery: Secondary | ICD-10-CM

## 2022-01-06 NOTE — Progress Notes (Signed)
Preoperative Dx: Facial aging, photoaging  Postoperative Dx:  same  Procedure: laser to face  Anesthesia: none  Description of Procedure:  Risks and complications were explained to the patient. Consent was confirmed and signed. Time out was called and all information was confirmed to be correct. The area  area was prepped with alcohol and wiped dry. The BBL 590 nm laser was set at 5 J/cm2. The face was lasered with 2 passes.  The BBL 515 nm laser was set at 5 J/cm and the face was lasered. The patient tolerated the procedure well and there were no complications. The patient is to follow up in 4 weeks.  Discussed post procedure treatment recommendations and restrictions.

## 2022-01-30 ENCOUNTER — Other Ambulatory Visit (HOSPITAL_COMMUNITY): Payer: Self-pay

## 2022-02-03 ENCOUNTER — Encounter: Payer: Self-pay | Admitting: Surgical

## 2022-02-03 ENCOUNTER — Other Ambulatory Visit: Payer: Self-pay

## 2022-02-03 ENCOUNTER — Ambulatory Visit (INDEPENDENT_AMBULATORY_CARE_PROVIDER_SITE_OTHER): Payer: No Typology Code available for payment source | Admitting: Surgical

## 2022-02-03 DIAGNOSIS — Z411 Encounter for cosmetic surgery: Secondary | ICD-10-CM

## 2022-02-03 NOTE — Progress Notes (Signed)
Preoperative Dx: Photoaging  Postoperative Dx:  same  Procedure: laser to face  Anesthesia: none  Description of Procedure:  Risks and complications were explained to the patient. Consent was confirmed and signed. Time out was called and all information was confirmed to be correct. The area  area was prepped with alcohol and wiped dry. The 590 nm BBL laser was set at 7 J/cm2. The face was lasered with 2 passes. The 515 nm laser was set at 8 j/cm2 and the face was lasered with 2 passes. The patient tolerated the procedure well and there were no complications. The patient is to follow up in 4 weeks.

## 2022-03-03 ENCOUNTER — Other Ambulatory Visit: Payer: Self-pay

## 2022-03-03 ENCOUNTER — Ambulatory Visit (INDEPENDENT_AMBULATORY_CARE_PROVIDER_SITE_OTHER): Payer: Self-pay | Admitting: Surgical

## 2022-03-03 ENCOUNTER — Other Ambulatory Visit (HOSPITAL_COMMUNITY): Payer: Self-pay

## 2022-03-03 DIAGNOSIS — Z411 Encounter for cosmetic surgery: Secondary | ICD-10-CM

## 2022-03-03 MED ORDER — NORETHIN ACE-ETH ESTRAD-FE 1-20 MG-MCG PO TABS
1.0000 | ORAL_TABLET | Freq: Every day | ORAL | 3 refills | Status: DC
  Filled 2022-03-03: qty 84, 84d supply, fill #0
  Filled 2022-05-11: qty 84, 84d supply, fill #1
  Filled 2022-07-15 – 2022-07-26 (×3): qty 84, 84d supply, fill #2
  Filled 2022-09-27: qty 84, 84d supply, fill #3
  Filled 2022-10-02: qty 84, 63d supply, fill #3

## 2022-03-03 NOTE — Progress Notes (Signed)
Preoperative Dx: Hyperpigmentation, photoaging ? ?Postoperative Dx:  same ? ?Procedure: laser to face ? ?Anesthesia: none ? ?Description of Procedure:  ?Risks and complications were explained to the patient. Consent was confirmed and signed. Time out was called and all information was confirmed to be correct. The area  area was prepped with alcohol and wiped dry. The 590 nm BBL laser laser was set at 7 J/cm2, 4 pulse width. The face was lasered with 2 passes.  The 515 nm laser was then set at 8 J/cm? and the face was lasered with 2 passes. The patient tolerated the procedure well and there were no complications. The patient is to follow up in 4-6 weeks. ? ?Patient was instructed on postop care. ? ? ?

## 2022-03-07 ENCOUNTER — Other Ambulatory Visit (HOSPITAL_COMMUNITY): Payer: Self-pay

## 2022-03-27 ENCOUNTER — Other Ambulatory Visit: Payer: Self-pay | Admitting: Obstetrics and Gynecology

## 2022-03-27 DIAGNOSIS — Z1231 Encounter for screening mammogram for malignant neoplasm of breast: Secondary | ICD-10-CM

## 2022-03-31 ENCOUNTER — Other Ambulatory Visit (HOSPITAL_COMMUNITY): Payer: Self-pay

## 2022-04-14 ENCOUNTER — Other Ambulatory Visit: Payer: No Typology Code available for payment source | Admitting: Surgical

## 2022-04-18 ENCOUNTER — Other Ambulatory Visit: Payer: Self-pay | Admitting: Family Medicine

## 2022-04-19 ENCOUNTER — Other Ambulatory Visit (HOSPITAL_COMMUNITY): Payer: Self-pay

## 2022-04-19 ENCOUNTER — Other Ambulatory Visit: Payer: Self-pay

## 2022-04-19 MED ORDER — ALPRAZOLAM 0.25 MG PO TABS
0.2500 mg | ORAL_TABLET | Freq: Two times a day (BID) | ORAL | 1 refills | Status: DC
  Filled 2022-04-19: qty 40, 20d supply, fill #0
  Filled 2022-06-15: qty 40, 30d supply, fill #1
  Filled 2022-06-16: qty 40, 30d supply, fill #0

## 2022-04-19 MED ORDER — ALPRAZOLAM 0.25 MG PO TABS: 0.2500 mg | ORAL_TABLET | Freq: Two times a day (BID) | ORAL | 1 refills | Status: DC

## 2022-04-19 NOTE — Progress Notes (Signed)
I sent this in

## 2022-04-20 ENCOUNTER — Other Ambulatory Visit (HOSPITAL_COMMUNITY): Payer: Self-pay

## 2022-04-20 ENCOUNTER — Ambulatory Visit
Admission: RE | Admit: 2022-04-20 | Discharge: 2022-04-20 | Disposition: A | Discharge status: Home / Self Care | Payer: No Typology Code available for payment source | Source: Ambulatory Visit | Attending: Obstetrics and Gynecology | Admitting: Obstetrics and Gynecology

## 2022-04-20 DIAGNOSIS — Z1231 Encounter for screening mammogram for malignant neoplasm of breast: Secondary | ICD-10-CM

## 2022-04-21 ENCOUNTER — Other Ambulatory Visit (HOSPITAL_COMMUNITY): Payer: Self-pay

## 2022-04-24 ENCOUNTER — Other Ambulatory Visit: Payer: Self-pay | Admitting: Obstetrics and Gynecology

## 2022-04-24 DIAGNOSIS — R928 Other abnormal and inconclusive findings on diagnostic imaging of breast: Secondary | ICD-10-CM

## 2022-05-03 ENCOUNTER — Ambulatory Visit
Admission: RE | Admit: 2022-05-03 | Discharge: 2022-05-03 | Disposition: A | Discharge status: Home / Self Care | Payer: No Typology Code available for payment source | Source: Ambulatory Visit | Attending: Obstetrics and Gynecology | Admitting: Obstetrics and Gynecology

## 2022-05-03 DIAGNOSIS — R928 Other abnormal and inconclusive findings on diagnostic imaging of breast: Secondary | ICD-10-CM

## 2022-05-11 ENCOUNTER — Other Ambulatory Visit (HOSPITAL_COMMUNITY): Payer: Self-pay

## 2022-05-17 ENCOUNTER — Encounter: Payer: Self-pay | Admitting: Surgical

## 2022-05-17 ENCOUNTER — Ambulatory Visit (INDEPENDENT_AMBULATORY_CARE_PROVIDER_SITE_OTHER): Payer: Self-pay | Admitting: Surgical

## 2022-05-17 DIAGNOSIS — Z411 Encounter for cosmetic surgery: Secondary | ICD-10-CM

## 2022-05-17 NOTE — Progress Notes (Signed)
Preoperative Dx: hyperpigmentation, melasma  Postoperative Dx:  same  Procedure: laser to face   Anesthesia: none  Description of Procedure:  Risks and complications were explained to the patient. Consent was confirmed and signed. Time out was called and all information was confirmed to be correct. The area  area was prepped with alcohol and wiped dry. The 590 nm laser was set at 7 J/cm2. The 560 nm laser was set at 7 j/cm2. The face was lasered with a total of 400 passes. The patient tolerated the procedure well and there were no complications.

## 2022-06-15 ENCOUNTER — Other Ambulatory Visit (HOSPITAL_COMMUNITY): Payer: Self-pay

## 2022-06-16 ENCOUNTER — Other Ambulatory Visit (HOSPITAL_COMMUNITY): Payer: Self-pay

## 2022-06-29 ENCOUNTER — Other Ambulatory Visit (HOSPITAL_COMMUNITY): Payer: Self-pay

## 2022-07-17 ENCOUNTER — Other Ambulatory Visit (HOSPITAL_COMMUNITY): Payer: Self-pay

## 2022-07-18 ENCOUNTER — Other Ambulatory Visit (HOSPITAL_COMMUNITY): Payer: Self-pay

## 2022-07-19 ENCOUNTER — Other Ambulatory Visit (HOSPITAL_COMMUNITY): Payer: Self-pay

## 2022-07-26 ENCOUNTER — Other Ambulatory Visit (HOSPITAL_COMMUNITY): Payer: Self-pay

## 2022-08-24 IMAGING — MG MM DIGITAL SCREENING BILAT W/ TOMO AND CAD
8 series · 8 of 24 positions shown · non-contrast
Comparison: Previous exam(s).

CLINICAL DATA: Screening.

EXAM:
DIGITAL SCREENING BILATERAL MAMMOGRAM WITH TOMOSYNTHESIS AND CAD
TECHNIQUE: Bilateral screening digital craniocaudal and mediolateral oblique
mammograms were obtained. Bilateral screening digital breast
tomosynthesis was performed. The images were evaluated with
computer-aided detection.

[L CC synth-2D]
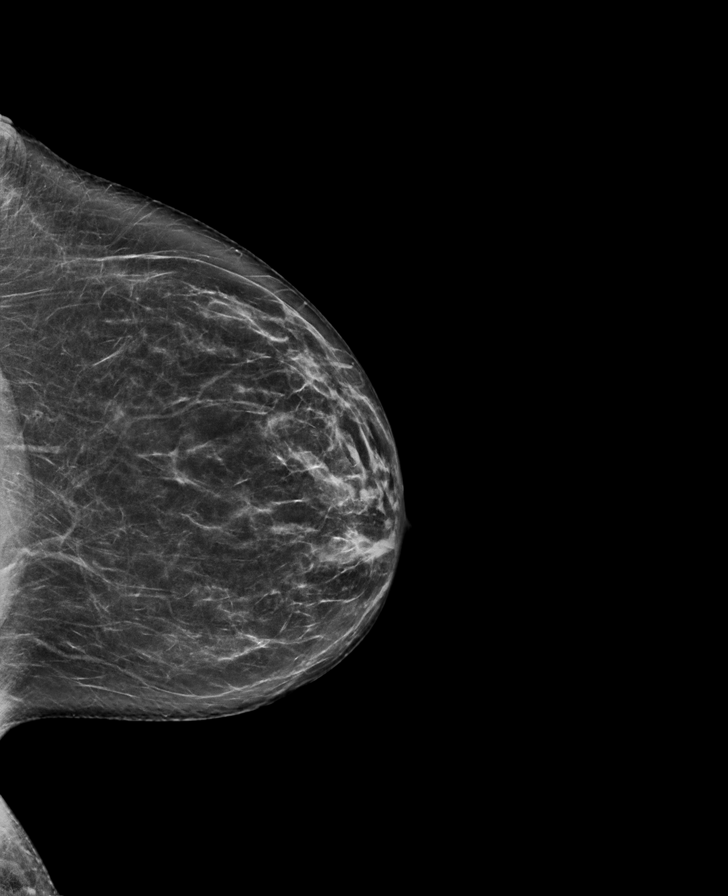

[L MLO synth-2D]
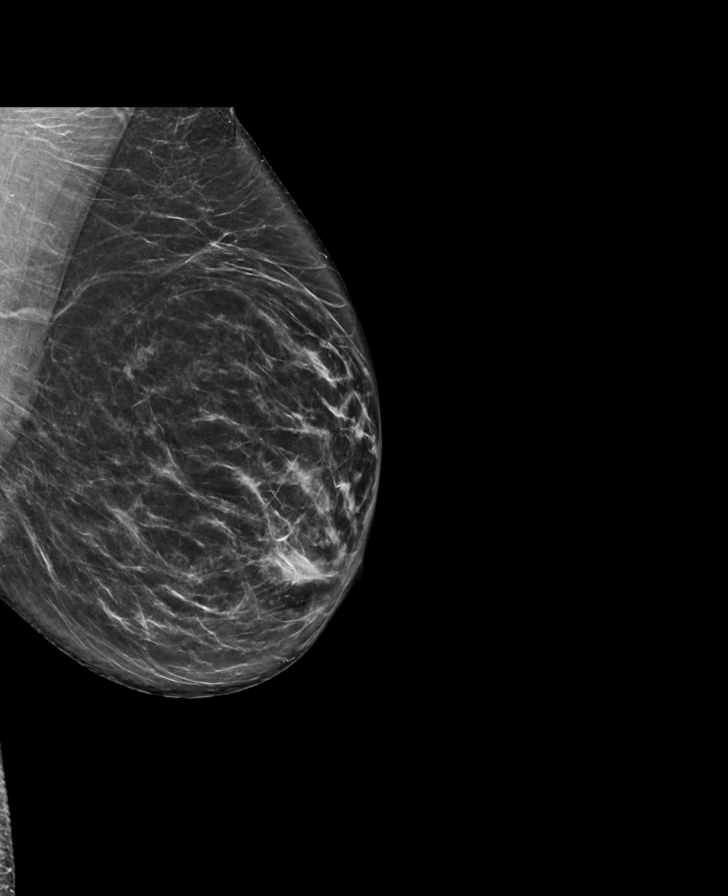

[R MLO synth-2D]
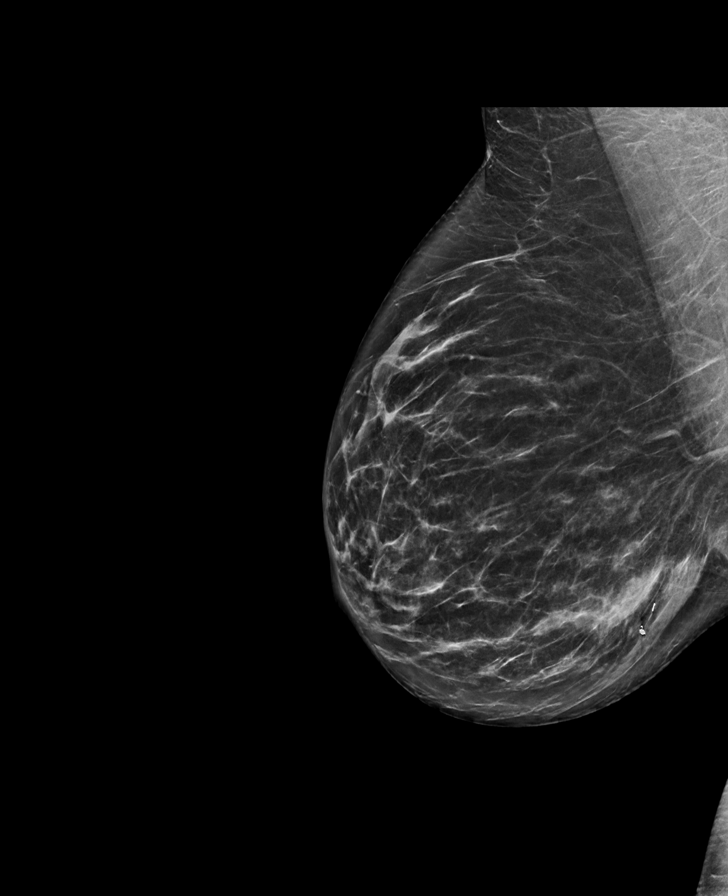

[R CC synth-2D]
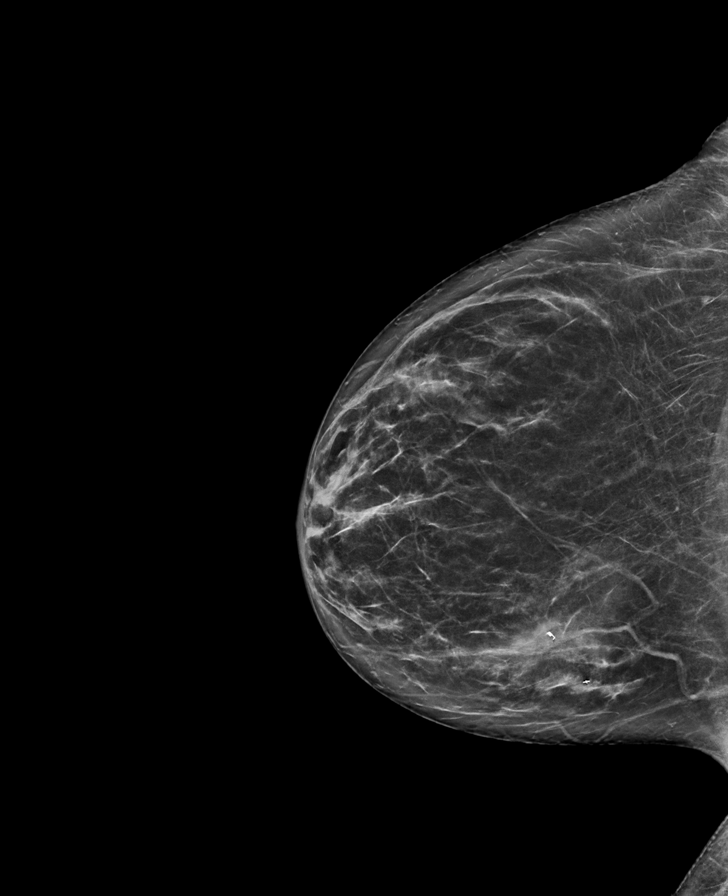

[R MLO tomo · tomo slice 39/76.0]
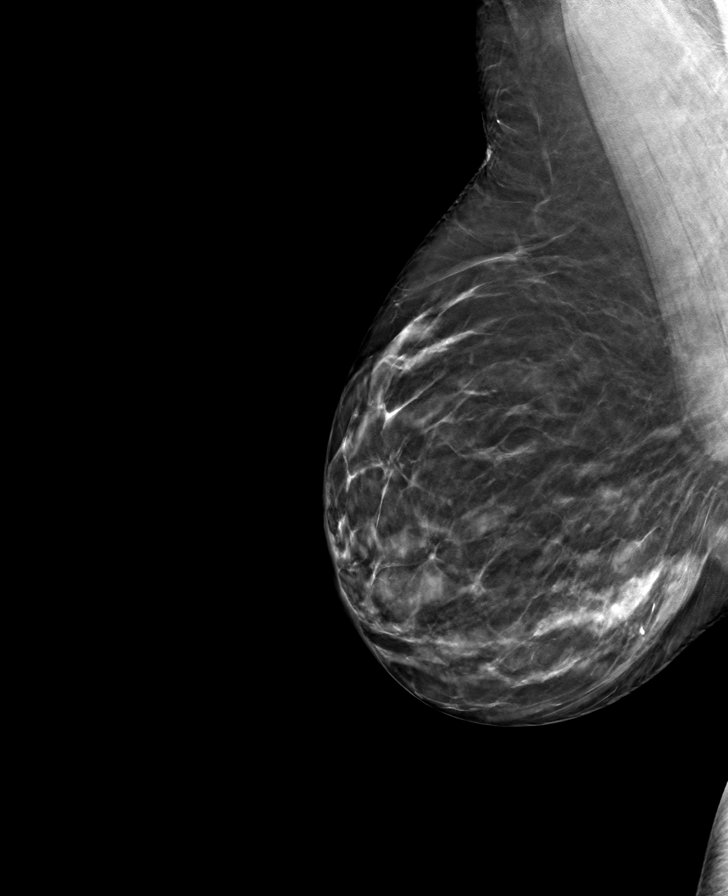

[R CC tomo · tomo slice 37/73.0]
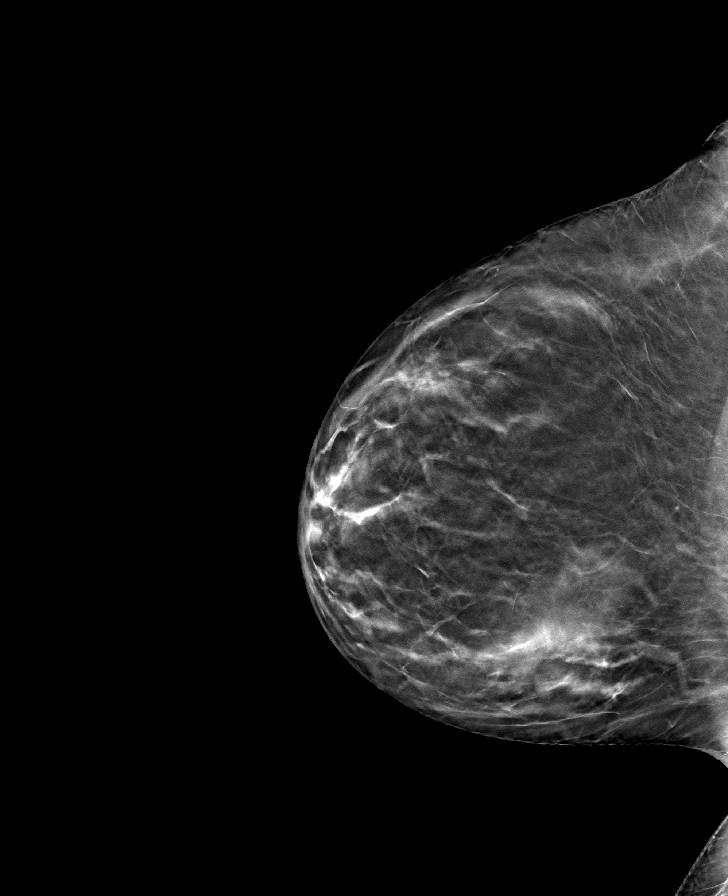

[L MLO tomo · tomo slice 39/76.0]
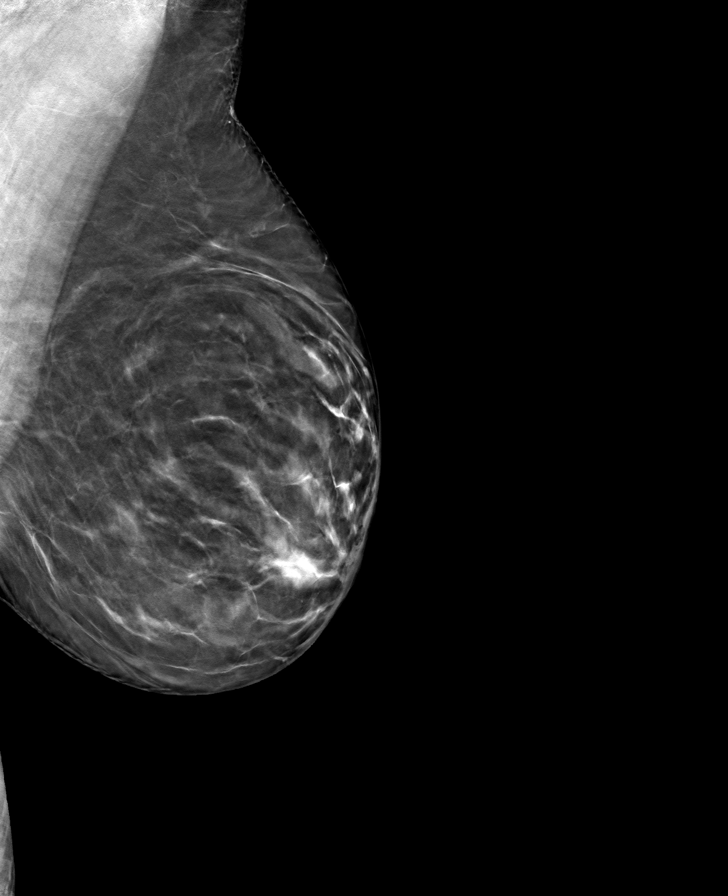

[L CC tomo · tomo slice 39/76.0]
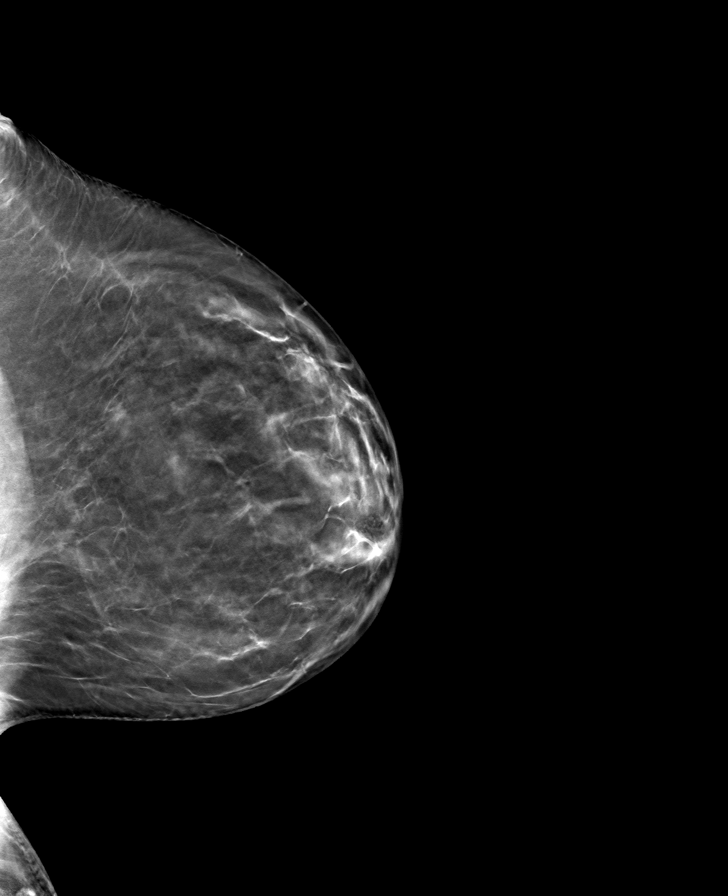

[8 of 24 positions shown; findings below may reference images not displayed]

ACR Breast Density Category b: There are scattered areas of
fibroglandular density.
FINDINGS: In the left breast, a possible asymmetry warrants further
evaluation. In the right breast, no findings suspicious for
malignancy.
IMPRESSION: Further evaluation is suggested for possible asymmetry in the left
breast.

RECOMMENDATION:
Diagnostic mammogram and possibly ultrasound of the left breast.
(Code:[id number])

The patient will be contacted regarding the findings, and additional
imaging will be scheduled.

BI-RADS CATEGORY  0: Incomplete. Need additional imaging evaluation
and/or prior mammograms for comparison.

## 2022-09-11 ENCOUNTER — Other Ambulatory Visit: Payer: Self-pay | Admitting: Family Medicine

## 2022-09-12 ENCOUNTER — Other Ambulatory Visit (HOSPITAL_COMMUNITY): Payer: Self-pay

## 2022-09-12 MED ORDER — ALPRAZOLAM 0.25 MG PO TABS
0.2500 mg | ORAL_TABLET | Freq: Two times a day (BID) | ORAL | 1 refills | Status: DC | PRN
  Filled 2022-09-12: qty 40, 30d supply, fill #0

## 2022-09-13 ENCOUNTER — Other Ambulatory Visit (HOSPITAL_COMMUNITY): Payer: Self-pay

## 2022-09-18 ENCOUNTER — Encounter: Payer: Self-pay | Admitting: *Deleted

## 2022-09-28 ENCOUNTER — Other Ambulatory Visit (HOSPITAL_COMMUNITY): Payer: Self-pay

## 2022-10-02 ENCOUNTER — Other Ambulatory Visit (HOSPITAL_COMMUNITY): Payer: Self-pay

## 2022-10-23 ENCOUNTER — Other Ambulatory Visit (HOSPITAL_COMMUNITY): Payer: Self-pay

## 2022-11-08 ENCOUNTER — Other Ambulatory Visit (HOSPITAL_COMMUNITY): Payer: Self-pay

## 2022-11-08 ENCOUNTER — Ambulatory Visit (INDEPENDENT_AMBULATORY_CARE_PROVIDER_SITE_OTHER): Payer: Self-pay | Admitting: Surgical

## 2022-11-08 DIAGNOSIS — Z719 Counseling, unspecified: Secondary | ICD-10-CM

## 2022-11-08 MED ORDER — HYDROQUINONE 4 % EX CREA
TOPICAL_CREAM | Freq: Two times a day (BID) | CUTANEOUS | 0 refills | Status: DC
  Filled 2022-11-08: qty 28.35, 30d supply, fill #0

## 2022-11-08 NOTE — Progress Notes (Unsigned)
    The patient gave consent to have this visit done by telemedicine / virtual visit, two identifiers were used to identify patient. This is also consent for access the chart and treat the patient via this visit. The patient is located in [place].  I, the provider, am at the office.  We spent 5 minutes together for the visit.  Joined by telephone.

## 2022-12-07 ENCOUNTER — Encounter: Payer: Self-pay | Admitting: *Deleted

## 2022-12-10 ENCOUNTER — Other Ambulatory Visit (HOSPITAL_COMMUNITY): Payer: Self-pay

## 2022-12-11 ENCOUNTER — Other Ambulatory Visit (HOSPITAL_COMMUNITY): Payer: Self-pay

## 2022-12-11 ENCOUNTER — Other Ambulatory Visit: Payer: Self-pay

## 2022-12-11 MED ORDER — NORETHIN ACE-ETH ESTRAD-FE 1-20 MG-MCG PO TABS
1.0000 | ORAL_TABLET | Freq: Every day | ORAL | 1 refills | Status: DC
  Filled 2022-12-11: qty 84, 84d supply, fill #0
  Filled 2023-02-08: qty 84, 84d supply, fill #1

## 2022-12-14 ENCOUNTER — Other Ambulatory Visit (HOSPITAL_COMMUNITY): Payer: Self-pay

## 2022-12-14 ENCOUNTER — Ambulatory Visit (INDEPENDENT_AMBULATORY_CARE_PROVIDER_SITE_OTHER): Payer: No Typology Code available for payment source | Admitting: Family Medicine

## 2022-12-14 ENCOUNTER — Encounter: Payer: Self-pay | Admitting: Family Medicine

## 2022-12-14 VITALS — BP 110/90 | HR 83 | Temp 97.9°F | Ht 66.5 in | Wt 167.8 lb

## 2022-12-14 DIAGNOSIS — Z0000 Encounter for general adult medical examination without abnormal findings: Secondary | ICD-10-CM | POA: Diagnosis not present

## 2022-12-14 DIAGNOSIS — Z13220 Encounter for screening for lipoid disorders: Secondary | ICD-10-CM | POA: Diagnosis not present

## 2022-12-14 DIAGNOSIS — Z130 Encounter for screening for diseases of the blood and blood-forming organs and certain disorders involving the immune mechanism: Secondary | ICD-10-CM | POA: Diagnosis not present

## 2022-12-14 MED ORDER — ALPRAZOLAM 0.25 MG PO TABS
0.2500 mg | ORAL_TABLET | Freq: Two times a day (BID) | ORAL | 1 refills | Status: DC | PRN
  Filled 2022-12-14 – 2022-12-27 (×2): qty 40, 20d supply, fill #0
  Filled 2023-03-26: qty 40, 20d supply, fill #1

## 2022-12-14 NOTE — Patient Instructions (Addendum)
Sign release of information at the check out desk for last pap smear- I know you did this last year but we did not receive  Update blood pressure at home and let me know your reading- we are allowed to update chart with that  Recommended follow up: Return in about 1 year (around 12/15/2023) for physical or sooner if needed.Schedule b4 you leave. -likely labs next year

## 2022-12-14 NOTE — Progress Notes (Signed)
Phone [phone]   Subjective:  Patient presents today for their annual physical. Chief complaint-noted.   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   Nonallopathic lesion of rib cage 08/12/2018    Priority: 1.   Low back pain 06/20/2012    Priority: 1.   Nonallopathic lesion of cervical region 05/19/2011    Priority: 1.   Nonallopathic lesion of thoracic region 05/19/2011    Priority: 1.   Nonallopathic lesion of lumbosacral region 05/19/2011    Priority: 1.   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   Past Surgical History:  Procedure Laterality Date   BREAST BIOPSY Right 02/05/2019   [place]   CESAREAN SECTION N/A 06/25/2015   Procedure: CESAREAN SECTION;  Surgeon: Everlene Farrier, MD;  Location: [facility];  Service: Obstetrics;  Laterality: N/A;  primary    HERNIA REPAIR     inguinal as baby   LIPOSUCTION MULTIPLE BODY PARTS     Stomach and hips   typanostomy tubes     WISDOM TOOTH EXTRACTION  1999    Family History  Problem Relation Age of Onset   Retinal detachment Mother    Arthritis Father    Hearing loss Father    Healthy Sister    Stroke Maternal Grandmother        on hospice in late 2023- bad infection in foot   Kidney disease Maternal Grandfather        died from this at 97. pacemaker   Other Paternal Grandmother        lived to 69   Other Paternal  Grandfather        lived to 46   Breast cancer Other        grandfathers sister mid to late 53s   Diabetes Neg Hx    Drug abuse Neg Hx    Early death Neg Hx    Heart disease Neg Hx    Hyperlipidemia Neg Hx    Hypertension Neg Hx    Cancer Neg Hx     Medications- reviewed and updated Current Outpatient Medications  Medication Sig Dispense Refill   escitalopram (LEXAPRO) 10 MG tablet Take 1 tablet (10 mg total) by mouth daily. 90 tablet 3   hydroquinone 4 % cream Apply topically 2 (two) times daily. 28.35 g 0   hydrOXYzine (ATARAX) 25 MG tablet Take 1 tablet (25 mg total) by mouth 2 (two) times daily as needed for anxiety 60 tablet 3   norethindrone-ethinyl estradiol-FE (BLISOVI FE 1/20) 1-20 MG-MCG tablet Take 1 tablet by mouth daily. 84 tablet 1   pantoprazole (PROTONIX) 20 MG tablet Take 1 tablet (20 mg total) by mouth daily as needed. 90 tablet 3   ALPRAZolam (XANAX) 0.25 MG tablet Take 1 tablet (0.25 mg total) by mouth 2 (two) times daily as needed for anxiety or sleep (do not drive for 8 hours after taking). [place]  tablet 1   No current facility-administered medications for this visit.    Allergies-reviewed and updated Allergies  Allergen Reactions   Seasonique [Levonorgestrel-Ethinyl Estrad] Hives    Social History   Social History Narrative   Married to [place]. Daughter Terrence Dupont born 06/25/2015- dyslexia and will start at [facility] [facility] [employer] January 2023.  Has a dog.       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 Enjoys being active, outdoor activities, hiking   Objective  Objective:  BP (!) 110/90   Pulse 83   Temp 97.9 F (36.6 C)   Ht 5' 6.5" (1.689 m)   Wt 167 lb 12.8 oz (76.1 kg)   SpO2 100%   BMI 26.68 kg/m  Gen: NAD, resting comfortably HEENT: Mucous membranes are moist. Oropharynx normal Neck: no thyromegaly CV: RRR no murmurs rubs or gallops Lungs: CTAB no crackles, wheeze,  rhonchi Abdomen: soft/nontender/nondistended/normal bowel sounds. No rebound or guarding.  Ext: no edema Skin: warm, dry Neuro: grossly normal, moves all extremities, PERRLA   Assessment and Plan   41 y.o. female presenting for annual physical.  Health Maintenance counseling: 1. Anticipatory guidance: Patient counseled regarding regular dental exams -q6 months, eye exams-follows regularly with history of floaters and mom's history of retinal detachment,  avoiding smoking and second hand smoke , limiting alcohol to 1 beverage per day- rare social and holidays , no illicit drugs .   2. Risk factor reduction:  Advised patient of need for regular exercise and diet rich and fruits and vegetables to reduce risk of heart attack and stroke.  Exercise-  stress relief for her-2 to 3 days a week at [employer] or will walk (walking 45 mins, [employer] there for 1.5 hours) Diet/weight management-has seen bariatric clinic in the past and was on phentermine-she is down 6 pounds from last year-some concern Lexapro may have contributed so avoiding bumping dose-was 159 back in 2021. Tried weight watchers- has then tried to cut out processed foods- more fruits/veggies- limit things in can- even got bread machine. She feels much better overall. Was down more even before the holidays.  Wt Readings from Last 3 Encounters:  12/14/22 167 lb 12.8 oz (76.1 kg)  01/06/22 170 lb (77.1 kg)  12/13/21 173 lb (78.5 kg)  3. Immunizations/screenings/ancillary studies-wants to hold off on COVID vaccination-reasonable with overall health  Immunization History  Administered Date(s) Administered   Influenza Whole 10/03/2012, 10/03/2021   Influenza,inj,Quad PF,6+ Mos 09/09/2019, 10/08/2022   Influenza-Unspecified 09/25/2015, 09/25/2016, 09/17/2017   MMR 06/27/2015   PFIZER(Purple Top)SARS-COV-2 Vaccination 12/20/2019, 01/10/2020   Tdap 09/15/2008, 12/06/2015   4. Cervical cancer screening- last PAP done 01/08/2018 with  a 3-year repeat recommended with physicians for womens- reports 02/20/21 but we will sign ROI to get records again today 5. Breast cancer screening-  breast exam with GYN and 3d mammogram yearly (required ultrasound as well last 2 years but reassuring) due to increased breast cancer lifetime risk based on genetic testing at 28% - only recs in this range  6. Colon cancer screening - no family history, start at age 40 as long as no blood in stool, melena  7. Skin cancer screening- follows with Dr. Delman Cheadle as needed. Advised regular sunscreen use. Denies worrisome, changing, or new skin lesions.  8. Birth control/STD check- monogamous and on birth control  44. Osteoporosis screening at 28- we will plan on this  10. Smoking associated screening - never smoker  Status of chronic or acute concerns   #BP slightly high- she will check at home- thinks anxiety related. Systolic looks great. Diastolic slighlty high  # Anxiety S:Medication: Lexapro 10 mg, hydroxyzine 25 mg as needed- once or twice a week sparing alprazolam perhaps 1 day a week for sleep (needed more with grandfathers passing and grandmother in hospice).  Remains on birth control.  Denies SI  Counseling: Refer to behavioral health last year- ended up not needing  A/P: doing well overall- some significant life stressors but honestly doing pretty well even with those- continue current medications    # GERD S:Medication: Protonix 20 mg-was taking regularly last year-had mentioned trial of Pepcid last year but she wanted to remain on current medicine A/P: stable- continue current medicines  -discussed checking b12- wants to wait until next year     # Screening hyperlipidemia S: Medication:None The 10-year ASCVD risk score (Arnett DK, et al., 2019) is: 0.5%  Lab Results  Component Value Date   CHOL 203 (H) 08/03/2020   HDL 53 08/03/2020   LDLCALC 125 (H) 08/03/2020   TRIG 131 08/03/2020   CHOLHDL 3.8 08/03/2020   A/P: No indication to  start medication but we have not checked in 2 years-we opted to update next visity to make sure no significant worsening- actually hoping improvement    # 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 Hears popping on that side. Has talked to dentist twice- may actually be a year. Annoying and painful.  - does not chew gum A/P: TMJD- may call [facility] vs can call back for PT referral   Recommended follow up: Return in about 1 year (around 12/15/2023) for physical or sooner if needed.Schedule b4 you leave.   Lab/Order associations: fasting   ICD-10-CM   1. Preventative health care  Z00.00     2. Screening for hyperlipidemia  Z13.220     3. Screening for deficiency anemia  Z13.0       Meds ordered this encounter  Medications   ALPRAZolam (XANAX) 0.25 MG tablet    Sig: Take 1 tablet (0.25 mg total) by mouth 2 (two) times daily as needed for anxiety or sleep (do not drive for 8 hours after taking).    Dispense:  40 tablet    Refill:  1    Return precautions advised.  Garret Reddish, MD

## 2022-12-22 ENCOUNTER — Other Ambulatory Visit (HOSPITAL_COMMUNITY): Payer: Self-pay

## 2022-12-26 ENCOUNTER — Other Ambulatory Visit: Payer: Self-pay | Admitting: Family Medicine

## 2022-12-26 ENCOUNTER — Other Ambulatory Visit (HOSPITAL_COMMUNITY): Payer: Self-pay

## 2022-12-26 MED ORDER — PANTOPRAZOLE SODIUM 20 MG PO TBEC
20.0000 mg | DELAYED_RELEASE_TABLET | Freq: Every day | ORAL | 3 refills | Status: DC | PRN
  Filled 2022-12-26: qty 90, 90d supply, fill #0
  Filled 2023-03-26: qty 90, 90d supply, fill #1
  Filled 2023-07-01: qty 90, 90d supply, fill #2
  Filled 2023-10-02: qty 90, 90d supply, fill #3

## 2022-12-27 ENCOUNTER — Other Ambulatory Visit (HOSPITAL_COMMUNITY): Payer: Self-pay

## 2023-01-18 ENCOUNTER — Other Ambulatory Visit (HOSPITAL_COMMUNITY): Payer: Self-pay

## 2023-01-18 ENCOUNTER — Other Ambulatory Visit: Payer: Self-pay | Admitting: Family Medicine

## 2023-01-18 MED ORDER — ESCITALOPRAM OXALATE 10 MG PO TABS
10.0000 mg | ORAL_TABLET | Freq: Every day | ORAL | 3 refills | Status: DC
Start: 2023-01-18 — End: 2024-01-08
  Filled 2023-01-18: qty 90, 90d supply, fill #0
  Filled 2023-04-17: qty 90, 90d supply, fill #1
  Filled 2023-07-22: qty 90, 90d supply, fill #2
  Filled 2023-10-18: qty 90, 90d supply, fill #3

## 2023-02-08 ENCOUNTER — Other Ambulatory Visit (HOSPITAL_COMMUNITY): Payer: Self-pay

## 2023-03-06 ENCOUNTER — Other Ambulatory Visit: Payer: Self-pay | Admitting: Obstetrics and Gynecology

## 2023-03-06 DIAGNOSIS — Z1231 Encounter for screening mammogram for malignant neoplasm of breast: Secondary | ICD-10-CM

## 2023-03-07 ENCOUNTER — Other Ambulatory Visit (HOSPITAL_COMMUNITY): Payer: Self-pay

## 2023-03-07 DIAGNOSIS — Z6827 Body mass index (BMI) 27.0-27.9, adult: Secondary | ICD-10-CM | POA: Diagnosis not present

## 2023-03-07 DIAGNOSIS — Z01419 Encounter for gynecological examination (general) (routine) without abnormal findings: Secondary | ICD-10-CM | POA: Diagnosis not present

## 2023-03-07 DIAGNOSIS — Z9189 Other specified personal risk factors, not elsewhere classified: Secondary | ICD-10-CM | POA: Diagnosis not present

## 2023-03-07 DIAGNOSIS — Z309 Encounter for contraceptive management, unspecified: Secondary | ICD-10-CM | POA: Diagnosis not present

## 2023-03-07 MED ORDER — NORETHIN ACE-ETH ESTRAD-FE 1-20 MG-MCG PO TABS
1.0000 | ORAL_TABLET | Freq: Every day | ORAL | 2 refills | Status: DC
  Filled 2023-03-07 – 2023-04-18 (×3): qty 84, 84d supply, fill #0
  Filled 2023-06-20: qty 84, 63d supply, fill #1

## 2023-03-27 ENCOUNTER — Other Ambulatory Visit (HOSPITAL_COMMUNITY): Payer: Self-pay

## 2023-03-27 ENCOUNTER — Other Ambulatory Visit: Payer: Self-pay

## 2023-03-30 ENCOUNTER — Other Ambulatory Visit (HOSPITAL_COMMUNITY): Payer: Self-pay

## 2023-03-30 MED ORDER — OMRON 3 SERIES BP MONITOR DEVI
0 refills | Status: DC
  Filled 2023-03-30: qty 1, 1d supply, fill #0

## 2023-04-02 ENCOUNTER — Other Ambulatory Visit (HOSPITAL_COMMUNITY): Payer: Self-pay

## 2023-04-02 ENCOUNTER — Encounter: Payer: Self-pay | Admitting: Family Medicine

## 2023-04-02 ENCOUNTER — Ambulatory Visit (INDEPENDENT_AMBULATORY_CARE_PROVIDER_SITE_OTHER): Payer: 59 | Admitting: Family Medicine

## 2023-04-02 VITALS — BP 134/92 | HR 98 | Temp 97.6°F | Ht 66.5 in | Wt 173.6 lb

## 2023-04-02 DIAGNOSIS — I10 Essential (primary) hypertension: Secondary | ICD-10-CM | POA: Diagnosis not present

## 2023-04-02 MED ORDER — HYDROCHLOROTHIAZIDE 12.5 MG PO TABS
12.5000 mg | ORAL_TABLET | Freq: Every day | ORAL | 3 refills | Status: DC
  Filled 2023-04-02: qty 90, 90d supply, fill #0
  Filled 2023-07-01: qty 90, 90d supply, fill #1
  Filled 2023-10-02: qty 90, 90d supply, fill #2
  Filled 2024-01-03: qty 90, 90d supply, fill #3

## 2023-04-02 NOTE — Progress Notes (Signed)
Phone [phone] In person visit   Subjective:   Caitlin Christensen is a 42 y.o. year old very pleasant female patient who presents for/with See problem oriented charting Chief Complaint  Patient presents with   Hypertension    Pt states she has been checkin her BPs and the highest she has gotten 143/103 and the lowest 123/93. She does c/o HA and eye twitches.    Past Medical History-  Patient Active Problem List   Diagnosis Date Noted   Essential hypertension 04/02/2023    Priority: Medium    GERD (gastroesophageal reflux disease) 10/06/2019    Priority: Medium    Anxiety 06/20/2012    Priority: Medium    Floaters 12/15/2015    Priority: Low   Nonallopathic lesion of sacral region 08/12/2018    Priority: 1.   Nonallopathic lesion of rib cage 08/12/2018    Priority: 1.   Low back pain 06/20/2012    Priority: 1.   Nonallopathic lesion of cervical region 05/19/2011    Priority: 1.   Nonallopathic lesion of thoracic region 05/19/2011    Priority: 1.   Nonallopathic lesion of lumbosacral region 05/19/2011    Priority: 1.   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 40 tablet 1   Blood Pressure Monitoring (OMRON 3 SERIES BP MONITOR) DEVI use as directed 1 each 0   escitalopram (LEXAPRO) 10 MG tablet Take 1 tablet (10 mg total) by mouth daily. 90 tablet 3   hydrochlorothiazide (HYDRODIURIL) 12.5 MG tablet Take 1 tablet (12.5 mg total) by mouth daily. 90 tablet 3   hydroquinone 4 % cream Apply topically 2 (two) times daily. 28.35 g 0   hydrOXYzine (ATARAX) 25 MG tablet Take 1 tablet (25 mg total) by mouth 2 (two) times daily as needed for anxiety 60 tablet 3   norethindrone-ethinyl estradiol-FE  (TARINA FE 1/20 EQ) 1-20 MG-MCG tablet Take 1 tablet by mouth daily. 84 tablet 2   pantoprazole (PROTONIX) 20 MG tablet Take 1 tablet (20 mg total) by mouth daily as needed. 90 tablet 3   No current facility-administered medications for this visit.     Objective:  BP (!) 134/92   Pulse 98   Temp 97.6 F (36.4 C)   Ht 5' 6.5" (1.689 m)   Wt 173 lb 9.6 oz (78.7 kg)   SpO2 100%   BMI 27.60 kg/m  Gen: NAD, resting comfortably CV: RRR no murmurs rubs or gallops Lungs: CTAB no crackles, wheeze, rhonchi Ext: no edema Skin: warm, dry      Assessment and Plan   #hypertension S: medication: None Chief Complaint  Patient presents with   Hypertension    Pt states she has been checkin her BPs and the highest she has gotten 143/103 and the lowest 123/93. She does c/o HA and eye twitches.  - she noted in last 2 months not getting diastolic below 90. Both manual and electronic cuffs.  -139/92 at gynecology in march - headache(s) every other day- tylenol helpful -twitching was only once- but Blood pressure was highest   - does not add salt to food and at baseline does more organic/healthy choices- not a lot of packaged meals or eating out - exercising 5 days a  week at least- at minimum gets a walk in but will go to gym on day off - does take sudafed once a day if not twice a day -chronic sinus irritation- can't breathe in nose when laying down- not much discharge BP Readings from Last 3 Encounters:  04/02/23 (!) 134/92  12/14/22 (!) 110/90  01/06/22 125/88  A/P: new diagnosis of hypertension - start hydrochlorothiazide 12.5 mg daily and update me in 2-4 weeks.  Already doing a reasonable job on diet and exercise -already had CBC, CMP last visit. Doubt UA or TSH helpful but can add to next labs -possible upcoming gynecology or ENT Dr. Jenne Pane interventions and wants BP controlled - on birth control  -advised stop/avoid sudafed and may be able to come off blood pressure medications  but  she does not think she could tolerate at this time -Discussed Lexapro may increase blood pressure but this has been very helpful for her and wants to maintain -Of note she is on birth control but may have fallopian tubes removed and will be off on August/July-that would be her form of pregnancy protection at that time  Recommended follow up: Return for next already scheduled visit or sooner if needed. Future Appointments  Date Time Provider Department Center  05/07/2023  9:20 AM [facility]  12/31/2023  8:00 AM Iolanda Folson, Aldine Contes, MD [facility]    Lab/Order associations:   ICD-10-CM   1. Essential hypertension  I10       Meds ordered this encounter  Medications   hydrochlorothiazide (HYDRODIURIL) 12.5 MG tablet    Sig: Take 1 tablet (12.5 mg total) by mouth daily.    Dispense:  90 tablet    Refill:  3    Return precautions advised.  Tana Conch, MD

## 2023-04-02 NOTE — Patient Instructions (Addendum)
new diagnosis of hypertension - start hydrochlorothiazide 12.5 mg daily and update me in 2-4 weeks  Recommended follow up: Return for next already scheduled visit or sooner if needed.

## 2023-04-02 NOTE — Assessment & Plan Note (Signed)
S: medication: None Chief Complaint  Patient presents with   Hypertension    Pt states she has been checkin her BPs and the highest she has gotten 143/103 and the lowest 123/93. She does c/o HA and eye twitches.  - she noted in last 2 months not getting diastolic below 90. Both manual and electronic cuffs.  -139/92 at gynecology in march - headache(s) every other day- tylenol helpful -twitching was only once- but Blood pressure was highest   - does not add salt to food and at baseline does more organic/healthy choices- not a lot of packaged meals or eating out - exercising 5 days a week at least- at minimum gets a walk in but will go to gym on day off - does take sudafed once a day if not twice a day -chronic sinus irritation- can't breathe in nose when laying down- not much discharge BP Readings from Last 3 Encounters:  04/02/23 (!) 134/92  12/14/22 (!) 110/90  01/06/22 125/88  A/P: new diagnosis of hypertension - start hydrochlorothiazide 12.5 mg daily and update me in 2-4 weeks.  Already doing a reasonable job on diet and exercise -already had CBC, CMP last visit. Doubt UA or TSH helpful but can add to next labs -possible upcoming gynecology or ENT Dr. Jenne Pane interventions and wants BP controlled - on birth control  -advised stop/avoid sudafed and may be able to come off blood pressure medications  but she does not think she could tolerate at this time -Discussed Lexapro may increase blood pressure but this has been very helpful for her and wants to maintain -Of note she is on birth control but may have fallopian tubes removed and will be off on August/July-that would be her form of pregnancy protection at that time

## 2023-04-03 ENCOUNTER — Other Ambulatory Visit (HOSPITAL_COMMUNITY): Payer: Self-pay

## 2023-04-11 DIAGNOSIS — J343 Hypertrophy of nasal turbinates: Secondary | ICD-10-CM | POA: Diagnosis not present

## 2023-04-11 DIAGNOSIS — J342 Deviated nasal septum: Secondary | ICD-10-CM | POA: Diagnosis not present

## 2023-04-16 ENCOUNTER — Other Ambulatory Visit (HOSPITAL_COMMUNITY): Payer: Self-pay

## 2023-04-18 ENCOUNTER — Other Ambulatory Visit (HOSPITAL_COMMUNITY): Payer: Self-pay

## 2023-04-18 ENCOUNTER — Other Ambulatory Visit: Payer: Self-pay | Admitting: Otolaryngology

## 2023-04-26 ENCOUNTER — Encounter: Payer: Self-pay | Admitting: Family Medicine

## 2023-05-07 ENCOUNTER — Ambulatory Visit
Admission: RE | Admit: 2023-05-07 | Discharge: 2023-05-07 | Disposition: A | Discharge status: Home / Self Care | Payer: 59 | Source: Ambulatory Visit | Attending: Obstetrics and Gynecology | Admitting: Obstetrics and Gynecology

## 2023-05-07 DIAGNOSIS — Z1231 Encounter for screening mammogram for malignant neoplasm of breast: Secondary | ICD-10-CM | POA: Diagnosis not present

## 2023-05-17 ENCOUNTER — Other Ambulatory Visit: Payer: Self-pay

## 2023-05-17 ENCOUNTER — Encounter (HOSPITAL_BASED_OUTPATIENT_CLINIC_OR_DEPARTMENT_OTHER): Payer: Self-pay | Admitting: Otolaryngology

## 2023-05-22 ENCOUNTER — Encounter (HOSPITAL_BASED_OUTPATIENT_CLINIC_OR_DEPARTMENT_OTHER)
Admission: RE | Admit: 2023-05-22 | Discharge: 2023-05-22 | Disposition: A | Discharge status: Home / Self Care | Payer: 59 | Source: Ambulatory Visit | Attending: Otolaryngology | Admitting: Otolaryngology

## 2023-05-22 DIAGNOSIS — Z01812 Encounter for preprocedural laboratory examination: Secondary | ICD-10-CM | POA: Diagnosis not present

## 2023-05-22 LAB — BASIC METABOLIC PANEL
Anion gap: 6 (ref 5–15)
BUN: 11 mg/dL (ref 6–20)
CO2: 22 mmol/L (ref 22–32)
Calcium: 8.3 mg/dL — ABNORMAL LOW (ref 8.9–10.3)
Chloride: 106 mmol/L (ref 98–111)
Creatinine, Ser: 0.72 mg/dL (ref 0.44–1.00)
GFR, Estimated: >60 mL/min (ref >60)
Glucose, Bld: 90 mg/dL (ref 70–99)
Potassium: 4.3 mmol/L (ref 3.5–5.1)
Sodium: 134 mmol/L — ABNORMAL LOW (ref 135–145)

## 2023-05-22 NOTE — Progress Notes (Signed)

## 2023-05-28 ENCOUNTER — Encounter (HOSPITAL_BASED_OUTPATIENT_CLINIC_OR_DEPARTMENT_OTHER): Payer: Self-pay | Admitting: Otolaryngology

## 2023-05-28 NOTE — Anesthesia Preprocedure Evaluation (Signed)
Anesthesia Evaluation  Patient identified by MRN, date of birth, ID band Patient awake    Reviewed: Allergy & Precautions, NPO status , Patient's Chart, lab work & pertinent test results  Airway Mallampati: II  TM Distance: >3 FB Neck ROM: Full    Dental no notable dental hx. (+) Dental Advisory Given   Pulmonary  Nasal septal deviation Turbinate hypertrophy   Pulmonary exam normal breath sounds clear to auscultation       Cardiovascular hypertension, Pt. on medications Normal cardiovascular exam Rhythm:Regular Rate:Normal     Neuro/Psych   Anxiety     negative neurological ROS     GI/Hepatic Neg liver ROS,GERD  Medicated,,  Endo/Other  negative endocrine ROS    Renal/GU negative Renal ROS  negative genitourinary   Musculoskeletal negative musculoskeletal ROS (+)    Abdominal   Peds  Hematology negative hematology ROS (+)   Anesthesia Other Findings   Reproductive/Obstetrics negative OB ROS                             Anesthesia Physical Anesthesia Plan  ASA: 2  Anesthesia Plan: General   Post-op Pain Management: Tylenol PO (pre-op)* and Precedex   Induction: Intravenous  PONV Risk Score and Plan: 4 or greater and Treatment may vary due to age or medical condition, Midazolam, Scopolamine patch - Pre-op, Ondansetron and Dexamethasone  Airway Management Planned: Oral ETT  Additional Equipment: None  Intra-op Plan:   Post-operative Plan: Extubation in OR  Informed Consent: I have reviewed the patients History and Physical, chart, labs and discussed the procedure including the risks, benefits and alternatives for the proposed anesthesia with the patient or authorized representative who has indicated his/her understanding and acceptance.     Dental advisory given  Plan Discussed with: Anesthesiologist and CRNA  Anesthesia Plan Comments:        Anesthesia Quick  Evaluation

## 2023-05-29 ENCOUNTER — Other Ambulatory Visit (HOSPITAL_COMMUNITY): Payer: Self-pay

## 2023-05-29 ENCOUNTER — Encounter (HOSPITAL_BASED_OUTPATIENT_CLINIC_OR_DEPARTMENT_OTHER)
Admission: RE | Disposition: A | Discharge status: Home / Self Care | Payer: Self-pay | Source: Home / Self Care | Attending: Otolaryngology

## 2023-05-29 ENCOUNTER — Ambulatory Visit (HOSPITAL_BASED_OUTPATIENT_CLINIC_OR_DEPARTMENT_OTHER): Payer: 59 | Admitting: Certified Registered"

## 2023-05-29 ENCOUNTER — Ambulatory Visit (HOSPITAL_BASED_OUTPATIENT_CLINIC_OR_DEPARTMENT_OTHER)
Admission: RE | Admit: 2023-05-29 | Discharge: 2023-05-29 | Disposition: A | Discharge status: Home / Self Care | Payer: 59 | Attending: Otolaryngology | Admitting: Otolaryngology

## 2023-05-29 ENCOUNTER — Other Ambulatory Visit: Payer: Self-pay

## 2023-05-29 ENCOUNTER — Encounter (HOSPITAL_BASED_OUTPATIENT_CLINIC_OR_DEPARTMENT_OTHER): Payer: Self-pay | Admitting: Otolaryngology

## 2023-05-29 DIAGNOSIS — J343 Hypertrophy of nasal turbinates: Secondary | ICD-10-CM | POA: Insufficient documentation

## 2023-05-29 DIAGNOSIS — F419 Anxiety disorder, unspecified: Secondary | ICD-10-CM | POA: Insufficient documentation

## 2023-05-29 DIAGNOSIS — K219 Gastro-esophageal reflux disease without esophagitis: Secondary | ICD-10-CM | POA: Insufficient documentation

## 2023-05-29 DIAGNOSIS — I10 Essential (primary) hypertension: Secondary | ICD-10-CM

## 2023-05-29 DIAGNOSIS — J342 Deviated nasal septum: Secondary | ICD-10-CM

## 2023-05-29 HISTORY — DX: Deviated nasal septum: J34.2

## 2023-05-29 HISTORY — PX: NASAL SEPTOPLASTY W/ TURBINOPLASTY: SHX2070

## 2023-05-29 HISTORY — DX: Essential (primary) hypertension: I10

## 2023-05-29 LAB — POCT PREGNANCY, URINE: Preg Test, Ur: NEGATIVE

## 2023-05-29 SURGERY — SEPTOPLASTY, NOSE, WITH NASAL TURBINATE REDUCTION
Anesthesia: General | Site: Nose | Laterality: Bilateral

## 2023-05-29 MED ORDER — DEXAMETHASONE SODIUM PHOSPHATE 4 MG/ML IJ SOLN
INTRAMUSCULAR | Status: DC | PRN
  Administered 2023-05-29: 10 mg via INTRAVENOUS

## 2023-05-29 MED ORDER — SCOPOLAMINE 1 MG/3DAYS TD PT72
1.0000 | MEDICATED_PATCH | TRANSDERMAL | Status: DC
  Administered 2023-05-29: 1.5 mg via TRANSDERMAL

## 2023-05-29 MED ORDER — HYDROMORPHONE HCL 1 MG/ML IJ SOLN
INTRAMUSCULAR | Status: AC
  Filled 2023-05-29: qty 0.5

## 2023-05-29 MED ORDER — HYDROMORPHONE HCL 1 MG/ML IJ SOLN
0.2500 mg | INTRAMUSCULAR | Status: DC | PRN
  Administered 2023-05-29 (×4): 0.5 mg via INTRAVENOUS

## 2023-05-29 MED ORDER — HYDROCODONE-ACETAMINOPHEN 5-325 MG PO TABS
1.0000 | ORAL_TABLET | Freq: Four times a day (QID) | ORAL | 0 refills | Status: DC | PRN
  Filled 2023-05-29: qty 12, 2d supply, fill #0

## 2023-05-29 MED ORDER — MUPIROCIN 2 % EX OINT
TOPICAL_OINTMENT | CUTANEOUS | Status: DC | PRN
  Administered 2023-05-29: 1 via NASAL

## 2023-05-29 MED ORDER — OXYCODONE HCL 5 MG PO TABS
ORAL_TABLET | ORAL | Status: AC
  Filled 2023-05-29: qty 1

## 2023-05-29 MED ORDER — AMISULPRIDE (ANTIEMETIC) 5 MG/2ML IV SOLN
INTRAVENOUS | Status: AC
  Filled 2023-05-29: qty 2

## 2023-05-29 MED ORDER — LIDOCAINE 2% (20 MG/ML) 5 ML SYRINGE
INTRAMUSCULAR | Status: AC
  Filled 2023-05-29: qty 5

## 2023-05-29 MED ORDER — PROPOFOL 10 MG/ML IV BOLUS
INTRAVENOUS | Status: AC
  Filled 2023-05-29: qty 20

## 2023-05-29 MED ORDER — ROCURONIUM BROMIDE 100 MG/10ML IV SOLN
INTRAVENOUS | Status: DC | PRN
  Administered 2023-05-29: 50 mg via INTRAVENOUS

## 2023-05-29 MED ORDER — ROCURONIUM BROMIDE 10 MG/ML (PF) SYRINGE
PREFILLED_SYRINGE | INTRAVENOUS | Status: AC
  Filled 2023-05-29: qty 10

## 2023-05-29 MED ORDER — SODIUM CHLORIDE 0.9 % IV SOLN
INTRAVENOUS | Status: AC | PRN
  Administered 2023-05-29: 50 mL

## 2023-05-29 MED ORDER — BACITRACIN ZINC 500 UNIT/GM EX OINT
TOPICAL_OINTMENT | CUTANEOUS | Status: AC
  Filled 2023-05-29: qty 28.35

## 2023-05-29 MED ORDER — SUGAMMADEX SODIUM 200 MG/2ML IV SOLN
INTRAVENOUS | Status: DC | PRN
  Administered 2023-05-29: 200 mg via INTRAVENOUS

## 2023-05-29 MED ORDER — MIDAZOLAM HCL 2 MG/2ML IJ SOLN
INTRAMUSCULAR | Status: AC
  Filled 2023-05-29: qty 2

## 2023-05-29 MED ORDER — CEFAZOLIN SODIUM-DEXTROSE 2-4 GM/100ML-% IV SOLN
INTRAVENOUS | Status: AC
  Filled 2023-05-29: qty 100

## 2023-05-29 MED ORDER — DEXMEDETOMIDINE HCL IN NACL 80 MCG/20ML IV SOLN
INTRAVENOUS | Status: DC | PRN
  Administered 2023-05-29: 8 ug via INTRAVENOUS
  Administered 2023-05-29: 12 ug via INTRAVENOUS

## 2023-05-29 MED ORDER — ONDANSETRON HCL 4 MG/2ML IJ SOLN: 4.0000 mg | Freq: Once | INTRAMUSCULAR | Status: DC | PRN

## 2023-05-29 MED ORDER — MUPIROCIN 2 % EX OINT
TOPICAL_OINTMENT | CUTANEOUS | Status: AC
  Filled 2023-05-29: qty 22

## 2023-05-29 MED ORDER — OXYMETAZOLINE HCL 0.05 % NA SOLN
NASAL | Status: DC | PRN
  Administered 2023-05-29: 1 via TOPICAL

## 2023-05-29 MED ORDER — FENTANYL CITRATE (PF) 100 MCG/2ML IJ SOLN
INTRAMUSCULAR | Status: AC
  Filled 2023-05-29: qty 2

## 2023-05-29 MED ORDER — LIDOCAINE-EPINEPHRINE 1 %-1:100000 IJ SOLN
INTRAMUSCULAR | Status: DC | PRN
  Administered 2023-05-29: 8 mL

## 2023-05-29 MED ORDER — CEFAZOLIN SODIUM-DEXTROSE 2-4 GM/100ML-% IV SOLN
2.0000 g | INTRAVENOUS | Status: AC
  Administered 2023-05-29: 2 g via INTRAVENOUS

## 2023-05-29 MED ORDER — OXYCODONE HCL 5 MG PO TABS
5.0000 mg | ORAL_TABLET | Freq: Once | ORAL | Status: AC | PRN
  Administered 2023-05-29: 5 mg via ORAL

## 2023-05-29 MED ORDER — OXYMETAZOLINE HCL 0.05 % NA SOLN
NASAL | Status: AC
  Filled 2023-05-29: qty 30

## 2023-05-29 MED ORDER — LIDOCAINE HCL (CARDIAC) PF 100 MG/5ML IV SOSY
PREFILLED_SYRINGE | INTRAVENOUS | Status: DC | PRN
  Administered 2023-05-29: 60 mg via INTRAVENOUS

## 2023-05-29 MED ORDER — CEPHALEXIN 500 MG PO CAPS
500.0000 mg | ORAL_CAPSULE | Freq: Three times a day (TID) | ORAL | 0 refills | Status: AC
  Filled 2023-05-29: qty 15, 5d supply, fill #0

## 2023-05-29 MED ORDER — LACTATED RINGERS IV SOLN: INTRAVENOUS | Status: DC

## 2023-05-29 MED ORDER — FENTANYL CITRATE (PF) 100 MCG/2ML IJ SOLN
INTRAMUSCULAR | Status: DC | PRN
  Administered 2023-05-29: 100 ug via INTRAVENOUS

## 2023-05-29 MED ORDER — LIDOCAINE-EPINEPHRINE 1 %-1:100000 IJ SOLN
INTRAMUSCULAR | Status: AC
  Filled 2023-05-29: qty 1

## 2023-05-29 MED ORDER — AMISULPRIDE (ANTIEMETIC) 5 MG/2ML IV SOLN
10.0000 mg | Freq: Once | INTRAVENOUS | Status: AC | PRN
  Administered 2023-05-29: 10 mg via INTRAVENOUS

## 2023-05-29 MED ORDER — PROMETHAZINE HCL 12.5 MG PO TABS
12.5000 mg | ORAL_TABLET | Freq: Four times a day (QID) | ORAL | 0 refills | Status: DC | PRN
  Filled 2023-05-29: qty 20, 5d supply, fill #0

## 2023-05-29 MED ORDER — PROPOFOL 10 MG/ML IV BOLUS
INTRAVENOUS | Status: DC | PRN
  Administered 2023-05-29: 200 mg via INTRAVENOUS

## 2023-05-29 MED ORDER — DEXAMETHASONE SODIUM PHOSPHATE 10 MG/ML IJ SOLN
INTRAMUSCULAR | Status: AC
  Filled 2023-05-29: qty 1

## 2023-05-29 MED ORDER — MIDAZOLAM HCL 5 MG/5ML IJ SOLN
INTRAMUSCULAR | Status: DC | PRN
  Administered 2023-05-29: 2 mg via INTRAVENOUS

## 2023-05-29 MED ORDER — SCOPOLAMINE 1 MG/3DAYS TD PT72
MEDICATED_PATCH | TRANSDERMAL | Status: AC
  Filled 2023-05-29: qty 1

## 2023-05-29 MED ORDER — OXYCODONE HCL 5 MG/5ML PO SOLN: 5.0000 mg | Freq: Once | ORAL | Status: AC | PRN

## 2023-05-29 MED ORDER — DEXMEDETOMIDINE HCL IN NACL 80 MCG/20ML IV SOLN
INTRAVENOUS | Status: AC
  Filled 2023-05-29: qty 20

## 2023-05-29 MED ORDER — ONDANSETRON HCL 4 MG/2ML IJ SOLN
INTRAMUSCULAR | Status: DC | PRN
  Administered 2023-05-29: 4 mg via INTRAVENOUS

## 2023-05-29 SURGICAL SUPPLY — 36 items
ATTRACTOMAT 16X20 MAGNETIC DRP (DRAPES) IMPLANT
BLADE INF TURB ROT M4 2 5PK (BLADE) IMPLANT
BLADE TRICUT ROTATE M4 4 5PK (BLADE) IMPLANT
CANISTER SUCT 1200ML W/VALVE (MISCELLANEOUS) ×1 IMPLANT
COAGULATOR SUCT 8FR VV (MISCELLANEOUS) IMPLANT
DRSG NASOPORE 8CM (GAUZE/BANDAGES/DRESSINGS) IMPLANT
DRSG TELFA 3X8 NADH STRL (GAUZE/BANDAGES/DRESSINGS) IMPLANT
ELECT REM PT RETURN 9FT ADLT (ELECTROSURGICAL) ×1
ELECTRODE REM PT RTRN 9FT ADLT (ELECTROSURGICAL) ×1 IMPLANT
GAUZE SPONGE 2X2 STRL 8-PLY (GAUZE/BANDAGES/DRESSINGS) ×1 IMPLANT
GLOVE BIO SURGEON STRL SZ7 (GLOVE) IMPLANT
GLOVE BIO SURGEON STRL SZ7.5 (GLOVE) ×1 IMPLANT
GLOVE BIOGEL PI IND STRL 7.0 (GLOVE) IMPLANT
GLOVE SURG SS PI 7.0 STRL IVOR (GLOVE) IMPLANT
GOWN STRL REUS W/ TWL LRG LVL3 (GOWN DISPOSABLE) ×2 IMPLANT
GOWN STRL REUS W/TWL LRG LVL3 (GOWN DISPOSABLE) ×3
HEMOSTAT SURGICEL .5X2 ABSORB (HEMOSTASIS) IMPLANT
IV NS 500ML (IV SOLUTION) ×1
IV NS 500ML BAXH (IV SOLUTION) ×1 IMPLANT
NDL HYPO 27GX1-1/4 (NEEDLE) ×1 IMPLANT
NEEDLE HYPO 27GX1-1/4 (NEEDLE) ×1 IMPLANT
NS IRRIG 1000ML POUR BTL (IV SOLUTION) IMPLANT
PACK BASIN DAY SURGERY FS (CUSTOM PROCEDURE TRAY) ×1 IMPLANT
PACK ENT DAY SURGERY (CUSTOM PROCEDURE TRAY) ×1 IMPLANT
PATTIES SURGICAL .5 X3 (DISPOSABLE) ×1 IMPLANT
SHEET SILICONE 2X3 0.03 REINF (MISCELLANEOUS) IMPLANT
SLEEVE SCD COMPRESS KNEE MED (STOCKING) ×1 IMPLANT
SPIKE FLUID TRANSFER (MISCELLANEOUS) IMPLANT
SPLINT NASAL AIRWAY SILICONE (MISCELLANEOUS) ×1 IMPLANT
SUT CHROMIC 2 0 SH (SUTURE) ×1 IMPLANT
SUT CHROMIC 4 0 P 3 18 (SUTURE) ×1 IMPLANT
SUT ETHILON 6 0 P 1 (SUTURE) IMPLANT
SUT PLAIN 4 0 ~~LOC~~ 1 (SUTURE) IMPLANT
TOWEL GREEN STERILE FF (TOWEL DISPOSABLE) ×1 IMPLANT
TRAY DSU PREP LF (CUSTOM PROCEDURE TRAY) ×1 IMPLANT
YANKAUER SUCT BULB TIP NO VENT (SUCTIONS) ×1 IMPLANT

## 2023-05-29 NOTE — Op Note (Signed)
PREOPERATIVE DIAGNOSIS:  Nasal septal deviation and turbinate hypertrophy   POSTOPERATIVE DIAGNOSIS:  Nasal septal deviation and turbinate hypertrophy   PROCEDURE:  Septoplasty and bilateral turbinate reduction   SURGEON:  Christia Reading, MD   ANESTHESIA:  General endotracheal anesthesia   COMPLICATIONS:  None   INDICATIONS:  The patient is a 42 year old female with a history of nasal obstruction due to septal deviation and turbinate hypertrophy that has not responeded to medical therapy.  She presents to the operating room for surgical management.   FINDINGS:  Septum deviates to the left, turbinates enlarged.   DESCRIPTION OF PROCEDURE:  The patient was identified in the holding room, informed consent having been obtained including discussion of risks, benefits and alternatives, the patient was brought to the operative suite and put the operative table in  supine position.  Anesthesia was induced and the patient was intubated by the anesthesia team without difficulty.  The eyes were taped closed.  The patient was given intravenous antibiotics during the case.  The face was prepped and draped in sterile fashion.   The septum was then injected with local anesthetic on both sides.  A left-sided Killian incision was made and the mucoperichondral flap was elevated down the left side.  The bony-cartilaginous junction was divided and some posterior bone removed.  Inferior cartilage was removed off of the maxillary spine that was then taken down using an osteotome.  A posterior portion of the quadrangular cartilage was then removed allowing the septum to position in the midline.  Soft tissues were redraped with a tear in the left-sided flap.  The inferior turbinates were injected with local anesthetic.  An incision was made at the anterior head of each inferior turbinate and soft tissues were elevated off of the underlying bone using a freer elevator.  The microdebrider was then used with the turbinate  blade to remove submucosal tissue keeping the overlying mucosa and underlying bone intact.  Soft tissues were then redraped and the turbinates out-fractured.  The nasal passage was suctioned.    Doyle stents coated in mupirocin ointment were then placed in both nasal passages.  The Killian incision was closed with 4-0 chromic in a simple, interrupted fashion.  Stents were placed in position and secured with a through-and-through vertical mattress suture using 2-0 Vicryl.  Nasal passages and the throat were suctioned.   Drapes were removed and the patient was cleaned off.  The patient was returned to anesthesia for wakeup, extubated, and taken to the recovery room in stable condition.

## 2023-05-29 NOTE — Discharge Instructions (Signed)

## 2023-05-29 NOTE — H&P (Signed)
Caitlin Christensen is an 42 y.o. female.   Chief Complaint: Nasal obstruction HPI: 42 year old female with history of nasal obstruction not responding to medical therapy.  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    Past Surgical History:  Procedure Laterality Date   BREAST BIOPSY Right 02/05/2019   PASH   CESAREAN SECTION N/A 06/25/2015   Procedure: CESAREAN SECTION;  Surgeon: Harold Hedge, MD;  Location: [facility];  Service: Obstetrics;  Laterality: N/A;  primary    HERNIA REPAIR     inguinal as baby   LIPOSUCTION MULTIPLE BODY PARTS     Stomach and hips   typanostomy tubes     WISDOM TOOTH EXTRACTION  1999    Family History  Problem Relation Age of Onset   Retinal detachment Mother    Arthritis Father    Hearing loss Father    Healthy Sister    Stroke Maternal Grandmother        on hospice in late 2023- bad infection in foot   Kidney disease Maternal Grandfather        died from this at 24. pacemaker   Other Paternal Grandmother        lived to 43   Other Paternal Grandfather        lived to 56   Breast cancer Other        grandfathers sister mid to late 71s   Diabetes Neg Hx    Drug abuse Neg Hx    Early death Neg Hx    Heart disease Neg Hx    Hyperlipidemia Neg Hx    Hypertension Neg Hx    Cancer Neg Hx    Social History:  reports that she has never smoked. She has never used smokeless tobacco. She reports current alcohol use. She reports that she does not use drugs.  Allergies:  Allergies  Allergen Reactions   Seasonique [Levonorgestrel-Ethinyl Estrad] Hives    Medications Prior to Admission  Medication Sig Dispense Refill   ALPRAZolam (XANAX) 0.25 MG tablet Take 1 tablet (0.25 mg total) by mouth 2 (two) times daily as needed for anxiety or sleep (do not drive for 8 hours after taking). 40 tablet  1   escitalopram (LEXAPRO) 10 MG tablet Take 1 tablet (10 mg total) by mouth daily. 90 tablet 3   hydrochlorothiazide (HYDRODIURIL) 12.5 MG tablet Take 1 tablet (12.5 mg total) by mouth daily. 90 tablet 3   hydrOXYzine (ATARAX) 25 MG tablet Take 1 tablet (25 mg total) by mouth 2 (two) times daily as needed for anxiety 60 tablet 3   norethindrone-ethinyl estradiol-FE (TARINA FE 1/20 EQ) 1-20 MG-MCG tablet Take 1 tablet by mouth daily. 84 tablet 2   pantoprazole (PROTONIX) 20 MG tablet Take 1 tablet (20 mg total) by mouth daily as needed. 90 tablet 3   Blood Pressure Monitoring (OMRON 3 SERIES BP MONITOR) DEVI use as directed 1 each 0   hydroquinone 4 % cream Apply topically 2 (two) times daily. 28.35 g 0    Results for orders placed or performed during the hospital encounter of 05/29/23 (from the past 48 hour(s))  Pregnancy, urine POC     Status: None   Collection Time: 05/29/23  9:07 AM  Result Value Ref Range   Preg Test, Ur NEGATIVE NEGATIVE    Comment:  THE SENSITIVITY OF THIS METHODOLOGY IS >24 mIU/mL    No results found.  Review of Systems  All other systems reviewed and are negative.   Blood pressure (!) 120/94, pulse 95, resp. rate 16, height 5' 6.5" (1.689 m), weight 76.6 kg, last menstrual period 04/04/2023, SpO2 99 %. Physical Exam Constitutional:      Appearance: Normal appearance. She is normal weight.  HENT:     Head: Normocephalic and atraumatic.     Right Ear: External ear normal.     Left Ear: External ear normal.     Nose: Nose normal.     Mouth/Throat:     Mouth: Mucous membranes are moist.     Pharynx: Oropharynx is clear.  Eyes:     Extraocular Movements: Extraocular movements intact.     Conjunctiva/sclera: Conjunctivae normal.     Pupils: Pupils are equal, round, and reactive to light.  Cardiovascular:     Rate and Rhythm: Normal rate.  Pulmonary:     Effort: Pulmonary effort is normal.  Musculoskeletal:     Cervical back: Normal range of  motion.  Skin:    General: Skin is warm and dry.  Neurological:     General: No focal deficit present.     Mental Status: She is alert and oriented to person, place, and time.  Psychiatric:        Mood and Affect: Mood normal.        Behavior: Behavior normal.        Thought Content: Thought content normal.        Judgment: Judgment normal.      Assessment/Plan Septal deviation and turbinate hypertrophy  To OR for septoplasty and bilateral turbinate reduction.  Christia Reading, MD 05/29/2023, 9:55 AM

## 2023-05-29 NOTE — Brief Op Note (Signed)
05/29/2023  10:56 AM  PATIENT:  Caitlin Christensen  42 y.o. female  PRE-OPERATIVE DIAGNOSIS:  Nasal turbinate hypertrophy Deviated nasal septum  POST-OPERATIVE DIAGNOSIS:  Nasal turbinate hypertrophyDeviated nasal septum  PROCEDURE:  Procedure(s): NASAL SEPTOPLASTY WITH TURBINATE REDUCTION (Bilateral)  SURGEON:  Surgeon(s) and Role:    Christia Reading, MD - Primary  PHYSICIAN ASSISTANT:   ASSISTANTS: none   ANESTHESIA:   general  EBL:  50 mL   BLOOD ADMINISTERED:none  DRAINS: none   LOCAL MEDICATIONS USED:  LIDOCAINE   SPECIMEN:  No Specimen  DISPOSITION OF SPECIMEN:  N/A  COUNTS:  YES  TOURNIQUET:  * No tourniquets in log *  DICTATION: .Note written in EPIC  PLAN OF CARE: Discharge to home after PACU  PATIENT DISPOSITION:  PACU - hemodynamically stable.   Delay start of Pharmacological VTE agent (>24hrs) due to surgical blood loss or risk of bleeding: no

## 2023-05-29 NOTE — Transfer of Care (Signed)
Immediate Anesthesia Transfer of Care Note  Patient: Caitlin Christensen  Procedure(s) Performed: NASAL SEPTOPLASTY WITH TURBINATE REDUCTION (Bilateral: Nose)  Patient Location: PACU  Anesthesia Type:General  Level of Consciousness: drowsy, patient cooperative, and responds to stimulation  Airway & Oxygen Therapy: Patient Spontanous Breathing and Patient connected to face mask oxygen  Post-op Assessment: Report given to RN and Post -op Vital signs reviewed and stable  Post vital signs: Reviewed and stable  Last Vitals:  Vitals Value Taken Time  BP    Temp    Pulse 88 05/29/23 1106  Resp    SpO2 98 % 05/29/23 1106  Vitals shown include unvalidated device data.  Last Pain:  Vitals:   05/29/23 0913  PainSc: 0-No pain      Patients Stated Pain Goal: 3 (05/29/23 0913)  Complications: No notable events documented.

## 2023-05-29 NOTE — Anesthesia Procedure Notes (Signed)
Procedure Name: Intubation Date/Time: 05/29/2023 10:21 AM  Performed by: Thornell Mule, CRNAPre-anesthesia Checklist: Patient identified, Emergency Drugs available, Suction available and Patient being monitored Patient Re-evaluated:Patient Re-evaluated prior to induction Oxygen Delivery Method: Circle system utilized Preoxygenation: Pre-oxygenation with 100% oxygen Induction Type: IV induction Ventilation: Mask ventilation without difficulty Laryngoscope Size: Miller and 3 Grade View: Grade I Tube type: Oral Tube size: 7.0 mm Number of attempts: 1 Airway Equipment and Method: Stylet and Oral airway Placement Confirmation: ETT inserted through vocal cords under direct vision, positive ETCO2 and breath sounds checked- equal and bilateral Secured at: 21 cm Tube secured with: Tape Dental Injury: Teeth and Oropharynx as per pre-operative assessment

## 2023-05-29 NOTE — Anesthesia Postprocedure Evaluation (Signed)
Anesthesia Post Note  Patient: Caitlin Christensen  Procedure(s) Performed: NASAL SEPTOPLASTY WITH TURBINATE REDUCTION (Bilateral: Nose)     Patient location during evaluation: PACU Anesthesia Type: General Level of consciousness: awake and alert and oriented Pain management: pain level controlled Vital Signs Assessment: post-procedure vital signs reviewed and stable Respiratory status: spontaneous breathing, nonlabored ventilation and respiratory function stable Cardiovascular status: blood pressure returned to baseline and stable Postop Assessment: no apparent nausea or vomiting Anesthetic complications: no   No notable events documented.  Last Vitals:  Vitals:   05/29/23 1215 05/29/23 1230  BP: 118/82 130/89  Pulse: 93 100  Resp: (!) 8 15  Temp:    SpO2: 92% 99%    Last Pain:  Vitals:   05/29/23 1230  PainSc: 2                  Loreli Debruler A.

## 2023-05-30 ENCOUNTER — Encounter (HOSPITAL_BASED_OUTPATIENT_CLINIC_OR_DEPARTMENT_OTHER): Payer: Self-pay | Admitting: Otolaryngology

## 2023-06-05 ENCOUNTER — Other Ambulatory Visit (HOSPITAL_COMMUNITY): Payer: Self-pay

## 2023-06-05 ENCOUNTER — Other Ambulatory Visit: Payer: Self-pay | Admitting: Family Medicine

## 2023-06-05 DIAGNOSIS — J342 Deviated nasal septum: Secondary | ICD-10-CM | POA: Diagnosis not present

## 2023-06-05 DIAGNOSIS — J343 Hypertrophy of nasal turbinates: Secondary | ICD-10-CM | POA: Diagnosis not present

## 2023-06-05 MED ORDER — ALPRAZOLAM 0.25 MG PO TABS
0.2500 mg | ORAL_TABLET | Freq: Two times a day (BID) | ORAL | 1 refills | Status: DC | PRN
  Filled 2023-06-05: qty 40, 20d supply, fill #0
  Filled 2023-10-15: qty 40, 20d supply, fill #1

## 2023-06-18 ENCOUNTER — Encounter (HOSPITAL_BASED_OUTPATIENT_CLINIC_OR_DEPARTMENT_OTHER): Payer: Self-pay | Admitting: Obstetrics and Gynecology

## 2023-06-18 ENCOUNTER — Other Ambulatory Visit: Payer: Self-pay

## 2023-06-18 NOTE — Progress Notes (Signed)
Spoke w/ via phone for pre-op interview---pt Lab needs dos----     I stat, urine preg, surgery orders requested dr Henderson Cloud epic ib           Lab results------current EKG 05-22-2023 epic COVID test -----patient states asymptomatic no test needed Arrive at -------530 am 07-05-2023 NPO after MN NO Solid Food.  Clear liquids from MN until---430 am Med rec completed Medications to take morning of surgery -----escitalopram, bc pill, protonix, alprazolam prn Diabetic medication -----n/a Patient instructed no nail polish to be worn day of surgery Patient instructed to bring photo id and insurance card day of surgery Patient aware to have Driver (ride ) / caregiver    husband Caitlin Christensen for 24 hours after surgery  Patient Special Instructions -----none Pre-Op special Instructions -----none Patient verbalized understanding of instructions that were given at this phone interview. Patient denies shortness of breath, chest pain, fever, cough at this phone interview.

## 2023-06-19 ENCOUNTER — Other Ambulatory Visit (HOSPITAL_COMMUNITY): Payer: Self-pay

## 2023-06-19 DIAGNOSIS — Z13228 Encounter for screening for other metabolic disorders: Secondary | ICD-10-CM | POA: Diagnosis not present

## 2023-06-19 DIAGNOSIS — Z130 Encounter for screening for diseases of the blood and blood-forming organs and certain disorders involving the immune mechanism: Secondary | ICD-10-CM | POA: Diagnosis not present

## 2023-06-19 MED ORDER — OXYCODONE HCL 5 MG PO TABS
5.0000 mg | ORAL_TABLET | Freq: Four times a day (QID) | ORAL | 0 refills | Status: DC
  Filled 2023-06-19: qty 10, 3d supply, fill #0

## 2023-06-20 ENCOUNTER — Other Ambulatory Visit (HOSPITAL_COMMUNITY): Payer: Self-pay

## 2023-06-20 ENCOUNTER — Other Ambulatory Visit: Payer: Self-pay | Admitting: Family Medicine

## 2023-06-20 MED ORDER — HYDROXYZINE HCL 25 MG PO TABS
25.0000 mg | ORAL_TABLET | Freq: Two times a day (BID) | ORAL | 3 refills | Status: DC | PRN
  Filled 2023-06-20: qty 60, 30d supply, fill #0

## 2023-06-20 MED ORDER — NORETHIN ACE-ETH ESTRAD-FE 1-20 MG-MCG PO TABS
1.0000 | ORAL_TABLET | Freq: Every day | ORAL | 4 refills | Status: DC
  Filled 2023-06-20: qty 84, 63d supply, fill #0

## 2023-06-20 NOTE — H&P (Signed)
Caitlin Christensen is an 42 y.o. female. She has elevated risk of breast cancer. Also BP is elevated on OCP. She does not want any more children and desires permanent sterilization.  Pertinent Gynecological History: Menses: flow is moderate Bleeding:  Contraception: OCP (estrogen/progesterone) DES exposure: denies Blood transfusions: none Sexually transmitted diseases: no past history Previous GYN Procedures:  Last mammogram: normal Date: 05/07/23 Last pap: normal Date: 3/22 OB History: G1, P1   Menstrual History: Menarche age: unknown Patient's last menstrual period was 04/25/2023.    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    Past Surgical History:  Procedure Laterality Date   BREAST BIOPSY Right 02/05/2019   PASH   CESAREAN SECTION N/A 06/25/2015   Procedure: CESAREAN SECTION;  Surgeon: Harold Hedge, MD;  Location: [facility];  Service: Obstetrics;  Laterality: N/A;  primary    HERNIA REPAIR     inguinal as baby   LIPOSUCTION MULTIPLE BODY PARTS     Stomach and hips   NASAL SEPTOPLASTY W/ TURBINOPLASTY Bilateral 05/29/2023   Procedure: NASAL SEPTOPLASTY WITH TURBINATE REDUCTION;  Surgeon: Christia Reading, MD;  Location: [facility];  Service: ENT;  Laterality: Bilateral;   typanostomy tubes     as baby   WISDOM TOOTH EXTRACTION  1999    Family History  Problem Relation Age of Onset   Retinal detachment Mother    Arthritis Father    Hearing loss Father    Healthy Sister    Stroke Maternal Grandmother        on hospice in late 2023- bad infection in foot   Kidney disease Maternal Grandfather        died from this at 47. pacemaker   Other Paternal Grandmother        lived to 74   Other Paternal Grandfather        lived to 4   Breast cancer Other        grandfathers sister mid  to late 1s   Diabetes Neg Hx    Drug abuse Neg Hx    Early death Neg Hx    Heart disease Neg Hx    Hyperlipidemia Neg Hx    Hypertension Neg Hx    Cancer Neg Hx     Social History:  reports that she has never smoked. She has never used smokeless tobacco. She reports current alcohol use. She reports that she does not use drugs.  Allergies:  Allergies  Allergen Reactions   Seasonique [Levonorgestrel-Ethinyl Estrad] Hives   Keflex [Cephalexin]     hives    No medications prior to admission.    Review of Systems  Constitutional:  Negative for fever.    Height 5' 6.5" (1.689 m), weight 77.1 kg, last menstrual period 04/25/2023. Physical Exam Cardiovascular:     Rate and Rhythm: Normal rate.  Pulmonary:     Effort: Pulmonary effort is normal.     No results found for this or any previous visit (from the past 24 hour(s)).  No results found.  Assessment/Plan: 42 yo G1P1 desires permanent sterilization D/W L/S with BS for sterilization and also possible cancer risk reduction Risks of surgery discussed including infection, organ damage, bleeding/transfusion-HIV/Hep, DVT/PE, pneumonia. Alternatives, permanence, failure rate and increased ectopic risk with sterilization discussed. She states she understands and agrees.  Roselle Locus II 06/20/2023, 1:06 PM

## 2023-06-21 ENCOUNTER — Other Ambulatory Visit (HOSPITAL_COMMUNITY): Payer: Self-pay

## 2023-06-21 MED ORDER — NORETHIN ACE-ETH ESTRAD-FE 1-20 MG-MCG PO TABS
1.0000 | ORAL_TABLET | Freq: Every day | ORAL | 4 refills | Status: DC
  Filled 2023-06-21: qty 84, 84d supply, fill #0

## 2023-06-27 ENCOUNTER — Other Ambulatory Visit (HOSPITAL_COMMUNITY): Payer: Self-pay

## 2023-07-04 MED ORDER — GENTAMICIN SULFATE 40 MG/ML IJ SOLN
400.0000 mg | INTRAVENOUS | Status: AC
  Administered 2023-07-05: 400 mg via INTRAVENOUS
  Filled 2023-07-04: qty 10

## 2023-07-04 MED ORDER — CLINDAMYCIN PHOSPHATE 900 MG/50ML IV SOLN
900.0000 mg | INTRAVENOUS | Status: AC
  Administered 2023-07-05: 900 mg via INTRAVENOUS

## 2023-07-04 NOTE — Anesthesia Preprocedure Evaluation (Signed)
Anesthesia Evaluation  Patient identified by MRN, date of birth, ID band Patient awake    Reviewed: Allergy & Precautions, NPO status , Patient's Chart, lab work & pertinent test results  History of Anesthesia Complications Negative for: history of anesthetic complications  Airway Mallampati: II  TM Distance: >3 FB Neck ROM: Full    Dental  (+) Dental Advisory Given, Teeth Intact   Pulmonary neg pulmonary ROS   breath sounds clear to auscultation       Cardiovascular hypertension, Pt. on medications (-) angina  Rhythm:Regular Rate:Normal     Neuro/Psych   Anxiety     Back pain    GI/Hepatic Neg liver ROS,GERD  Medicated and Controlled,,  Endo/Other  negative endocrine ROS    Renal/GU negative Renal ROS     Musculoskeletal TMJ   Abdominal   Peds  Hematology negative hematology ROS (+)   Anesthesia Other Findings   Reproductive/Obstetrics                             Anesthesia Physical Anesthesia Plan  ASA: 2  Anesthesia Plan: General   Post-op Pain Management: Tylenol PO (pre-op)*   Induction: Intravenous  PONV Risk Score and Plan: 3 and Ondansetron, Dexamethasone and Scopolamine patch - Pre-op  Airway Management Planned: Oral ETT  Additional Equipment: None  Intra-op Plan:   Post-operative Plan: Extubation in OR  Informed Consent: I have reviewed the patients History and Physical, chart, labs and discussed the procedure including the risks, benefits and alternatives for the proposed anesthesia with the patient or authorized representative who has indicated his/her understanding and acceptance.     Dental advisory given  Plan Discussed with: CRNA and Surgeon  Anesthesia Plan Comments:        Anesthesia Quick Evaluation

## 2023-07-05 ENCOUNTER — Other Ambulatory Visit: Payer: Self-pay

## 2023-07-05 ENCOUNTER — Encounter (HOSPITAL_BASED_OUTPATIENT_CLINIC_OR_DEPARTMENT_OTHER)
Admission: RE | Disposition: A | Discharge status: Home / Self Care | Payer: Self-pay | Source: Home / Self Care | Attending: Obstetrics and Gynecology

## 2023-07-05 ENCOUNTER — Ambulatory Visit (HOSPITAL_BASED_OUTPATIENT_CLINIC_OR_DEPARTMENT_OTHER): Payer: 59 | Admitting: Anesthesiology

## 2023-07-05 ENCOUNTER — Ambulatory Visit (HOSPITAL_BASED_OUTPATIENT_CLINIC_OR_DEPARTMENT_OTHER)
Admission: RE | Admit: 2023-07-05 | Discharge: 2023-07-05 | Disposition: A | Discharge status: Home / Self Care | Payer: 59 | Attending: Obstetrics and Gynecology | Admitting: Obstetrics and Gynecology

## 2023-07-05 ENCOUNTER — Encounter (HOSPITAL_BASED_OUTPATIENT_CLINIC_OR_DEPARTMENT_OTHER): Payer: Self-pay | Admitting: Obstetrics and Gynecology

## 2023-07-05 DIAGNOSIS — Z302 Encounter for sterilization: Secondary | ICD-10-CM

## 2023-07-05 DIAGNOSIS — Z79899 Other long term (current) drug therapy: Secondary | ICD-10-CM | POA: Diagnosis not present

## 2023-07-05 DIAGNOSIS — K219 Gastro-esophageal reflux disease without esophagitis: Secondary | ICD-10-CM | POA: Insufficient documentation

## 2023-07-05 DIAGNOSIS — Z01818 Encounter for other preprocedural examination: Secondary | ICD-10-CM

## 2023-07-05 DIAGNOSIS — I10 Essential (primary) hypertension: Secondary | ICD-10-CM | POA: Insufficient documentation

## 2023-07-05 HISTORY — PX: LAPAROSCOPIC UNILATERAL SALPINGECTOMY: SHX5934

## 2023-07-05 HISTORY — DX: Anxiety disorder, unspecified: F41.9

## 2023-07-05 LAB — CBC
HCT: 47 % — ABNORMAL HIGH (ref 36.0–46.0)
Hemoglobin: 14.2 g/dL (ref 12.0–15.0)
MCH: 31 pg (ref 26.0–34.0)
MCHC: 30.2 g/dL (ref 30.0–36.0)
MCV: 102.6 fL — ABNORMAL HIGH (ref 80.0–100.0)
Platelets: 312 10*3/uL (ref 150–400)
RBC: 4.58 MIL/uL (ref 3.87–5.11)
RDW: 12.9 % (ref 11.5–15.5)
WBC: 5.3 10*3/uL (ref 4.0–10.5)
nRBC: 0 % (ref 0.0–0.2)

## 2023-07-05 LAB — POCT PREGNANCY, URINE
Preg Test, Ur: NEGATIVE
Preg Test, Ur: NEGATIVE

## 2023-07-05 LAB — POCT I-STAT, CHEM 8
BUN: 14 mg/dL (ref 6–20)
Calcium, Ion: 1.1 mmol/L — ABNORMAL LOW (ref 1.15–1.40)
Chloride: 110 mmol/L (ref 98–111)
Creatinine, Ser: 0.6 mg/dL (ref 0.44–1.00)
Glucose, Bld: 88 mg/dL (ref 70–99)
HCT: 45 % (ref 36.0–46.0)
Hemoglobin: 15.3 g/dL — ABNORMAL HIGH (ref 12.0–15.0)
Potassium: 4 mmol/L (ref 3.5–5.1)
Sodium: 140 mmol/L (ref 135–145)
TCO2: 21 mmol/L — ABNORMAL LOW (ref 22–32)

## 2023-07-05 LAB — TYPE AND SCREEN
ABO/RH(D): O POS
Antibody Screen: NEGATIVE

## 2023-07-05 SURGERY — SALPINGECTOMY, UNILATERAL, LAPAROSCOPIC
Anesthesia: General | Site: Pelvis | Laterality: Bilateral

## 2023-07-05 MED ORDER — SOD CITRATE-CITRIC ACID 500-334 MG/5ML PO SOLN: 30.0000 mL | ORAL | Status: DC

## 2023-07-05 MED ORDER — OXYCODONE HCL 5 MG/5ML PO SOLN: 5.0000 mg | Freq: Once | ORAL | Status: AC | PRN

## 2023-07-05 MED ORDER — KETOROLAC TROMETHAMINE 30 MG/ML IJ SOLN
INTRAMUSCULAR | Status: AC
  Filled 2023-07-05: qty 1

## 2023-07-05 MED ORDER — OXYCODONE HCL 5 MG PO TABS
ORAL_TABLET | ORAL | Status: AC
  Filled 2023-07-05: qty 1

## 2023-07-05 MED ORDER — ACETAMINOPHEN 500 MG PO TABS
1000.0000 mg | ORAL_TABLET | Freq: Once | ORAL | Status: AC
  Administered 2023-07-05: 1000 mg via ORAL

## 2023-07-05 MED ORDER — DEXAMETHASONE SODIUM PHOSPHATE 10 MG/ML IJ SOLN
INTRAMUSCULAR | Status: AC
  Filled 2023-07-05: qty 1

## 2023-07-05 MED ORDER — GLYCOPYRROLATE 0.2 MG/ML IJ SOLN
INTRAMUSCULAR | Status: DC | PRN
  Administered 2023-07-05 (×2): .1 mg via INTRAVENOUS

## 2023-07-05 MED ORDER — ONDANSETRON HCL 4 MG/2ML IJ SOLN
INTRAMUSCULAR | Status: DC | PRN
  Administered 2023-07-05: 4 mg via INTRAVENOUS

## 2023-07-05 MED ORDER — CLINDAMYCIN PHOSPHATE 900 MG/50ML IV SOLN
INTRAVENOUS | Status: AC
  Filled 2023-07-05: qty 50

## 2023-07-05 MED ORDER — ACETAMINOPHEN 500 MG PO TABS
ORAL_TABLET | ORAL | Status: AC
  Filled 2023-07-05: qty 2

## 2023-07-05 MED ORDER — HEMOSTATIC AGENTS (NO CHARGE) OPTIME
TOPICAL | Status: DC | PRN
  Administered 2023-07-05: 1 via TOPICAL

## 2023-07-05 MED ORDER — LIDOCAINE 2% (20 MG/ML) 5 ML SYRINGE
INTRAMUSCULAR | Status: DC | PRN
  Administered 2023-07-05: 60 mg via INTRAVENOUS
  Administered 2023-07-05: 20 mg via INTRAVENOUS

## 2023-07-05 MED ORDER — PROPOFOL 10 MG/ML IV BOLUS
INTRAVENOUS | Status: AC
  Filled 2023-07-05: qty 20

## 2023-07-05 MED ORDER — POVIDONE-IODINE 10 % EX SWAB: 2.0000 | Freq: Once | CUTANEOUS | Status: DC

## 2023-07-05 MED ORDER — SCOPOLAMINE 1 MG/3DAYS TD PT72
1.0000 | MEDICATED_PATCH | TRANSDERMAL | Status: DC
  Administered 2023-07-05: 1.5 mg via TRANSDERMAL

## 2023-07-05 MED ORDER — PROMETHAZINE HCL 25 MG/ML IJ SOLN: 6.2500 mg | INTRAMUSCULAR | Status: DC | PRN

## 2023-07-05 MED ORDER — KETOROLAC TROMETHAMINE 30 MG/ML IJ SOLN
INTRAMUSCULAR | Status: DC | PRN
  Administered 2023-07-05: 30 mg via INTRAVENOUS

## 2023-07-05 MED ORDER — BUPIVACAINE HCL (PF) 0.5 % IJ SOLN
INTRAMUSCULAR | Status: DC | PRN
  Administered 2023-07-05: 10 mL
  Administered 2023-07-05: 20 mL

## 2023-07-05 MED ORDER — SCOPOLAMINE 1 MG/3DAYS TD PT72
MEDICATED_PATCH | TRANSDERMAL | Status: AC
  Filled 2023-07-05: qty 1

## 2023-07-05 MED ORDER — ROCURONIUM BROMIDE 10 MG/ML (PF) SYRINGE
PREFILLED_SYRINGE | INTRAVENOUS | Status: DC | PRN
  Administered 2023-07-05: 60 mg via INTRAVENOUS

## 2023-07-05 MED ORDER — DEXMEDETOMIDINE HCL IN NACL 80 MCG/20ML IV SOLN
INTRAVENOUS | Status: AC
  Filled 2023-07-05: qty 20

## 2023-07-05 MED ORDER — LACTATED RINGERS IV SOLN: INTRAVENOUS | Status: DC

## 2023-07-05 MED ORDER — GLYCOPYRROLATE PF 0.2 MG/ML IJ SOSY
PREFILLED_SYRINGE | INTRAMUSCULAR | Status: AC
  Filled 2023-07-05: qty 1

## 2023-07-05 MED ORDER — OXYCODONE HCL 5 MG PO TABS
5.0000 mg | ORAL_TABLET | Freq: Once | ORAL | Status: AC | PRN
  Administered 2023-07-05: 5 mg via ORAL

## 2023-07-05 MED ORDER — PHENYLEPHRINE 80 MCG/ML (10ML) SYRINGE FOR IV PUSH (FOR BLOOD PRESSURE SUPPORT)
PREFILLED_SYRINGE | INTRAVENOUS | Status: DC | PRN
  Administered 2023-07-05 (×3): 80 ug via INTRAVENOUS

## 2023-07-05 MED ORDER — ONDANSETRON HCL 4 MG/2ML IJ SOLN
INTRAMUSCULAR | Status: AC
  Filled 2023-07-05: qty 2

## 2023-07-05 MED ORDER — FENTANYL CITRATE (PF) 100 MCG/2ML IJ SOLN
INTRAMUSCULAR | Status: AC
  Filled 2023-07-05: qty 2

## 2023-07-05 MED ORDER — MIDAZOLAM HCL 2 MG/2ML IJ SOLN
INTRAMUSCULAR | Status: AC
  Filled 2023-07-05: qty 2

## 2023-07-05 MED ORDER — DEXAMETHASONE SODIUM PHOSPHATE 10 MG/ML IJ SOLN
INTRAMUSCULAR | Status: DC | PRN
  Administered 2023-07-05: 10 mg via INTRAVENOUS

## 2023-07-05 MED ORDER — HYDROMORPHONE HCL 1 MG/ML IJ SOLN: 0.2500 mg | INTRAMUSCULAR | Status: DC | PRN

## 2023-07-05 MED ORDER — MIDAZOLAM HCL 2 MG/2ML IJ SOLN: 0.5000 mg | Freq: Once | INTRAMUSCULAR | Status: DC | PRN

## 2023-07-05 MED ORDER — MEPERIDINE HCL 25 MG/ML IJ SOLN: 6.2500 mg | INTRAMUSCULAR | Status: DC | PRN

## 2023-07-05 MED ORDER — LIDOCAINE HCL (PF) 2 % IJ SOLN
INTRAMUSCULAR | Status: AC
  Filled 2023-07-05: qty 5

## 2023-07-05 MED ORDER — SUGAMMADEX SODIUM 200 MG/2ML IV SOLN
INTRAVENOUS | Status: DC | PRN
  Administered 2023-07-05: 200 mg via INTRAVENOUS

## 2023-07-05 MED ORDER — MIDAZOLAM HCL 5 MG/5ML IJ SOLN
INTRAMUSCULAR | Status: DC | PRN
  Administered 2023-07-05: 2 mg via INTRAVENOUS

## 2023-07-05 MED ORDER — ROCURONIUM BROMIDE 10 MG/ML (PF) SYRINGE
PREFILLED_SYRINGE | INTRAVENOUS | Status: AC
  Filled 2023-07-05: qty 10

## 2023-07-05 MED ORDER — DEXMEDETOMIDINE HCL IN NACL 80 MCG/20ML IV SOLN
INTRAVENOUS | Status: DC | PRN
  Administered 2023-07-05: 8 ug via INTRAVENOUS
  Administered 2023-07-05: 4 ug via INTRAVENOUS

## 2023-07-05 MED ORDER — PHENYLEPHRINE 80 MCG/ML (10ML) SYRINGE FOR IV PUSH (FOR BLOOD PRESSURE SUPPORT)
PREFILLED_SYRINGE | INTRAVENOUS | Status: AC
  Filled 2023-07-05: qty 10

## 2023-07-05 MED ORDER — FENTANYL CITRATE (PF) 100 MCG/2ML IJ SOLN
INTRAMUSCULAR | Status: DC | PRN
  Administered 2023-07-05: 100 ug via INTRAVENOUS

## 2023-07-05 MED ORDER — PROPOFOL 10 MG/ML IV BOLUS
INTRAVENOUS | Status: DC | PRN
  Administered 2023-07-05: 10 mg via INTRAVENOUS
  Administered 2023-07-05: 200 mg via INTRAVENOUS

## 2023-07-05 MED ORDER — SODIUM CHLORIDE 0.9 % IR SOLN
Status: DC | PRN
  Administered 2023-07-05: 500 mL

## 2023-07-05 SURGICAL SUPPLY — 55 items
ADH SKN CLS APL DERMABOND .7 (GAUZE/BANDAGES/DRESSINGS) ×1
APL SKNCLS STERI-STRIP NONHPOA (GAUZE/BANDAGES/DRESSINGS)
APL SRG 38 LTWT LNG FL B (MISCELLANEOUS) ×1
APPLICATOR ARISTA FLEXITIP XL (MISCELLANEOUS) IMPLANT
BARRIER ADHS 3X4 INTERCEED (GAUZE/BANDAGES/DRESSINGS) IMPLANT
BENZOIN TINCTURE PRP APPL 2/3 (GAUZE/BANDAGES/DRESSINGS) IMPLANT
BLADE CLIPPER SENSICLIP SURGIC (BLADE) IMPLANT
BRR ADH 4X3 ABS CNTRL BYND (GAUZE/BANDAGES/DRESSINGS)
CABLE HIGH FREQUENCY MONO STRZ (ELECTRODE) IMPLANT
CATH ROBINSON RED A/P 16FR (CATHETERS) ×1 IMPLANT
COVER MAYO STAND STRL (DRAPES) ×1 IMPLANT
DERMABOND ADVANCED .7 DNX12 (GAUZE/BANDAGES/DRESSINGS) ×1 IMPLANT
DRAPE SURG IRRIG POUCH 19X23 (DRAPES) ×1 IMPLANT
DURAPREP 26ML APPLICATOR (WOUND CARE) ×1 IMPLANT
ELECT REM PT RETURN 9FT ADLT (ELECTROSURGICAL)
ELECTRODE REM PT RTRN 9FT ADLT (ELECTROSURGICAL) IMPLANT
GAUZE 4X4 16PLY ~~LOC~~+RFID DBL (SPONGE) ×1 IMPLANT
GLOVE BIO SURGEON STRL SZ8 (GLOVE) ×2 IMPLANT
GOWN STRL REUS W/TWL XL LVL3 (GOWN DISPOSABLE) ×1 IMPLANT
HEMOSTAT ARISTA ABSORB 3G PWDR (HEMOSTASIS) IMPLANT
HOLDER FOLEY CATH W/STRAP (MISCELLANEOUS) IMPLANT
IRRIG SUCT STRYKERFLOW 2 WTIP (MISCELLANEOUS) ×1
IRRIGATION SUCT STRKRFLW 2 WTP (MISCELLANEOUS) ×1 IMPLANT
KIT PINK PAD W/HEAD ARE REST (MISCELLANEOUS) ×2
KIT PINK PAD W/HEAD ARM REST (MISCELLANEOUS) ×2 IMPLANT
KIT TURNOVER CYSTO (KITS) ×1 IMPLANT
NDL INSUFFLATION 14GA 120MM (NEEDLE) ×1 IMPLANT
NDL INSUFFLATION 14GA 150MM (NEEDLE) IMPLANT
NEEDLE INSUFFLATION 14GA 120MM (NEEDLE) ×1 IMPLANT
NEEDLE INSUFFLATION 14GA 150MM (NEEDLE) IMPLANT
NS IRRIG 500ML POUR BTL (IV SOLUTION) ×1 IMPLANT
PACK LAPAROSCOPY BASIN (CUSTOM PROCEDURE TRAY) ×1 IMPLANT
PAD OB MATERNITY 4.3X12.25 (PERSONAL CARE ITEMS) ×1 IMPLANT
PAD PREP 24X48 CUFFED NSTRL (MISCELLANEOUS) ×1 IMPLANT
SCISSORS LAP 5X45 EPIX DISP (ENDOMECHANICALS) IMPLANT
SEALER TISSUE G2 CVD JAW 45CM (ENDOMECHANICALS) ×1 IMPLANT
SET TUBE SMOKE EVAC HIGH FLOW (TUBING) ×1 IMPLANT
SLEEVE SCD COMPRESS KNEE MED (STOCKING) ×1 IMPLANT
SOL ELECTROSURG ANTI STICK (MISCELLANEOUS)
SOLUTION ELECTROSURG ANTI STCK (MISCELLANEOUS) IMPLANT
SPIKE FLUID TRANSFER (MISCELLANEOUS) IMPLANT
SUT VIC AB 3-0 PS2 18 (SUTURE)
SUT VIC AB 3-0 PS2 18XBRD (SUTURE) IMPLANT
SUT VIC AB 4-0 PS2 27 (SUTURE) ×1 IMPLANT
SUT VICRYL 0 UR6 27IN ABS (SUTURE) ×1 IMPLANT
SYR 30ML LL (SYRINGE) ×1 IMPLANT
SYS BAG RETRIEVAL 10MM (BASKET)
SYSTEM BAG RETRIEVAL 10MM (BASKET) IMPLANT
TOWEL OR 17X24 6PK STRL BLUE (TOWEL DISPOSABLE) ×1 IMPLANT
TRAY FOLEY W/BAG SLVR 14FR LF (SET/KITS/TRAYS/PACK) IMPLANT
TROCAR BALLN 12MMX100 BLUNT (TROCAR) IMPLANT
TROCAR Z-THREAD FIOS 11X100 BL (TROCAR) ×1 IMPLANT
TROCAR Z-THREAD FIOS 5X100MM (TROCAR) ×1 IMPLANT
TUBE CONNECTING 12X1/4 (SUCTIONS) IMPLANT
WARMER LAPAROSCOPE (MISCELLANEOUS) ×1 IMPLANT

## 2023-07-05 NOTE — Transfer of Care (Signed)
Immediate Anesthesia Transfer of Care Note  Patient: JACQUELI PANGALLO  Procedure(s) Performed: LAPAROSCOPIC BILATERAL SALPINGECTOMY (Bilateral: Pelvis)  Patient Location: PACU  Anesthesia Type:General  Level of Consciousness: awake, drowsy, and patient cooperative  Airway & Oxygen Therapy: Patient Spontanous Breathing and Patient connected to nasal cannula oxygen  Post-op Assessment: Report given to RN and Post -op Vital signs reviewed and stable  Post vital signs: Reviewed and stable  Last Vitals:  Vitals Value Taken Time  BP 107/75 07/05/23 0840  Temp 36.3 C 07/05/23 0842  Pulse 88 07/05/23 0842  Resp 17 07/05/23 0842  SpO2 95 % 07/05/23 0842  Vitals shown include unfiled device data.  Last Pain:  Vitals:   07/05/23 0654  TempSrc: Oral      Patients Stated Pain Goal: 4 (07/05/23 0654)  Complications: No notable events documented.

## 2023-07-05 NOTE — Anesthesia Postprocedure Evaluation (Signed)
Anesthesia Post Note  Patient: Caitlin Christensen  Procedure(s) Performed: LAPAROSCOPIC BILATERAL SALPINGECTOMY (Bilateral: Pelvis)     Patient location during evaluation: Phase II Anesthesia Type: General Level of consciousness: awake and alert, patient cooperative and oriented Pain management: pain level controlled Vital Signs Assessment: post-procedure vital signs reviewed and stable Respiratory status: spontaneous breathing, nonlabored ventilation and respiratory function stable Cardiovascular status: blood pressure returned to baseline and stable Postop Assessment: no apparent nausea or vomiting, able to ambulate and adequate PO intake Anesthetic complications: no   No notable events documented.  Last Vitals:  Vitals:   07/05/23 0930 07/05/23 0954  BP: 117/84 116/86  Pulse: 76 (!) 50  Resp: 12 16  Temp:  36.5 C  SpO2: 98% 100%    Last Pain:  Vitals:   07/05/23 0954  TempSrc:   PainSc: 2                  Charlottie Peragine,E. Tava Peery

## 2023-07-05 NOTE — Discharge Instructions (Addendum)
  Post Anesthesia Home Care Instructions  Activity: Get plenty of rest for the remainder of the day. A responsible individual must stay with you for 24 hours following the procedure.  For the next 24 hours, DO NOT: -Drive a car -Advertising copywriter -Drink alcoholic beverages -Take any medication unless instructed by your physician -Make any legal decisions or sign important papers.  Meals: Start with liquid foods such as gelatin or soup. Progress to regular foods as tolerated. Avoid greasy, spicy, heavy foods. If nausea and/or vomiting occur, drink only clear liquids until the nausea and/or vomiting subsides. Call your physician if vomiting continues.  Special Instructions/Symptoms: Your throat may feel dry or sore from the anesthesia or the breathing tube placed in your throat during surgery. If this causes discomfort, gargle with warm salt water. The discomfort should disappear within 24 hours.  If you had a scopolamine patch placed behind your ear for the management of post- operative nausea and/or vomiting:  1. The medication in the patch is effective for 72 hours, after which it should be removed.  Wrap patch in a tissue and discard in the trash. Wash hands thoroughly with soap and water. 2. You may remove the patch earlier than 72 hours if you experience unpleasant side effects which may include dry mouth, dizziness or visual disturbances. 3. Avoid touching the patch. Wash your hands with soap and water after contact with the patch.      HOME CARE INSTRUCTIONS - LAPAROSCOPY  Wound Care: The bandaids or dressing which are placed over the skin openings may be removed the day after surgery. The incision should be kept clean and dry. The stitches do not need to be removed. Should the incision become sore, red, and swollen after the first week, check with your doctor.  Personal Hygiene: Shower the day after your procedure. Always wipe from front to back after elimination.   Activity: Do  not drive or operate any equipment today. The effects of the anesthesia are still present and drowsiness may result. Rest today, not necessarily flat bed rest, just take it easy. You may resume your normal activity in one to three days or as instructed by your physician.  Sexual Activity: You resume sexual activity as indicated by your physician_________. If your laparoscopy was for a sterilization ( tubes tied ), continue current method of birth control until after your next period or ask for specific instructions from your doctor.  Diet: Eat a light diet as desired this evening. You may resume a regular diet tomorrow.  Return to Work: Two to three days or as indicated by your doctor.  Expectations After Surgery: Your surgery will cause vaginal drainage or spotting which may continue for 2-3 days. Mild abdominal discomfort or tenderness is not unusual and some shoulder pain may also be noted which can be relieved by lying flat in pain.  Call Your Doctor If these Occur:  Persistent or heavy bleeding at incision site       Redness or swelling around incision       Elevation of temperature greater than 100 degrees F    MAY TAKE TYLENOL AFTER 1:00 PM IF NEEDED. MAY TAKE ADVIL, MOTRIN, ALEVE OR ANY NSAID AFTER 2:30 PM ID NEEDED.

## 2023-07-05 NOTE — Anesthesia Procedure Notes (Signed)
Procedure Name: Intubation Date/Time: 07/05/2023 7:33 AM  Performed by: Bishop Limbo, CRNAPre-anesthesia Checklist: Patient identified, Emergency Drugs available, Suction available and Patient being monitored Patient Re-evaluated:Patient Re-evaluated prior to induction Oxygen Delivery Method: Circle System Utilized Preoxygenation: Pre-oxygenation with 100% oxygen Induction Type: IV induction Ventilation: Mask ventilation without difficulty Laryngoscope Size: Mac and 3 Grade View: Grade I Tube type: Oral Tube size: 7.0 mm Number of attempts: 1 Airway Equipment and Method: Stylet Placement Confirmation: ETT inserted through vocal cords under direct vision, positive ETCO2 and breath sounds checked- equal and bilateral Secured at: 22 cm Tube secured with: Tape Dental Injury: Teeth and Oropharynx as per pre-operative assessment

## 2023-07-05 NOTE — Brief Op Note (Signed)
07/05/2023  8:32 AM  PATIENT:  Caitlin Christensen  42 y.o. female  PRE-OPERATIVE DIAGNOSIS:  CONTRACEPTION  POST-OPERATIVE DIAGNOSIS:  CONTRACEPTION  PROCEDURE:  Procedure(s): LAPAROSCOPIC BILATERAL SALPINGECTOMY (Bilateral)  SURGEON:  Surgeons and Role:    Harold Hedge, MD - Primary  PHYSICIAN ASSISTANT:   ASSISTANTS: none   ANESTHESIA:   general  EBL:  5 mL   BLOOD ADMINISTERED:none  DRAINS: none   LOCAL MEDICATIONS USED:  MARCAINE    and Amount: 30 ml  SPECIMEN:  No Specimen  DISPOSITION OF SPECIMEN:  N/A  COUNTS:  YES  TOURNIQUET:  * No tourniquets in log *  DICTATION: .Other Dictation: Dictation Number [id number]  PLAN OF CARE: Discharge to home after PACU  PATIENT DISPOSITION:  PACU - hemodynamically stable.   Delay start of Pharmacological VTE agent (>24hrs) due to surgical blood loss or risk of bleeding: not applicable

## 2023-07-05 NOTE — Progress Notes (Signed)
No changes to H&P per patient history Reviewed procedure-L/S BS Discussed PO instructions Allergies Seasonique and Keflex All questions answered. She states she understands and agrees

## 2023-07-05 NOTE — Op Note (Signed)
NAME: Caitlin Christensen, Caitlin Christensen MEDICAL RECORD NO: [id number] ACCOUNT NO: [id number] DATE OF BIRTH: 12-31-1980 FACILITY: [facility] LOCATION: [facility] PHYSICIAN: Guy Sandifer. Arleta Creek, MD  Operative Report   DATE OF PROCEDURE: 07/05/2023  PREOPERATIVE DIAGNOSIS:  Desires permanent sterilization.  POSTOPERATIVE DIAGNOSIS:  Desires permanent sterilization.  PROCEDURE:  Bilateral salpingectomy.  ASSISTANT:  Harold Hedge, MD.  ANESTHESIA:  General with endotracheal intubation.  ESTIMATED BLOOD LOSS:  5 mL.  SPECIMENS:  Bilateral fallopian tubes to pathology.  INDICATIONS AND CONSENT: This patient is a 42 year old patient who desires permanent sterilization.  She has elevated lifetime risk for breast cancer and cannot tolerate the birth control pill.  Options have been reviewed and laparoscopic bilateral  salpingectomy has been discussed.  Potential risks and complications are discussed preoperatively including, but not limited to infection, organ damage, bleeding requiring transfusion of blood products with HIV and hepatitis acquisition, DVT, PE,  pneumonia.  Failure rate and increased ectopic risk has also been discussed.  All questions have been answered and consent is signed on the chart.  FINDINGS:  Upper abdomen is grossly normal.  Uterus is 6 weeks in size, smooth in contour.  Anterior and posterior cul-de-sacs were normal.  Ovaries were normal bilaterally.  Fallopian tubes normal bilaterally.  DESCRIPTION OF PROCEDURE:  Patient was taken to the operating room where she was identified, placed in the dorsal supine position and general anesthesia was induced via endotracheal intubation.  She was placed in the dorsal lithotomy position.  Timeout  was done.  She was prepped vaginally with Betadine, straight catheterized and prepped abdominally with ChloraPrep.  A Hulka tenaculum was placed in the uterus as a manipulator.  After 3-minute drying time, she was draped in sterile fashion.  The   infraumbilical and suprapubic areas were injected with 10 mL of 0.5% plain Marcaine.  Small infraumbilical incision was made and a disposable Veress needle was placed on the first attempt without difficulty.  A good syringe and drop test are noted.  Two  liters of gas were insufflated under low pressure with good tympany in the right upper quadrant.  Veress needle was removed.  A 10/11 Xcel bladeless disposable trocar sleeve was placed under direct visualization using the diagnostic scope.  After  placement, the operative scope was used.  A small suprapubic incision was made and a 5 mm disposable trocar sleeve was placed under direct visualization without difficulty.  Then, using the EnSeal bipolar cautery cutting instrument, the right fallopian  tube was taken down along its entire length with care being taken to maintain excellent hemostasis.  The tube was amputated on its proximal end.  It was placed in the anterior cul-de-sac.  Similar procedure was carried out on the left side.  There are  physiologic adhesions of the sigmoid at the left pelvic brim.  However, these are well clear of the area of surgery.  The left fallopian tube is removed through the umbilical trocar sleeve.  It was marked separately for pathology.  The right fallopian  tube was then removed as well.  Inspection revealed continued hemostasis.  The remaining approximately 20 mL of 0.5% plain Marcaine was instilled in the peritoneal cavity.  Arista was placed over the pedicles as well.  Suprapubic trocar sleeve was  removed, pneumoperitoneum was reduced and the umbilical trocar sleeve was removed.  Umbilical incision was closed with 0 Vicryl in the subcutaneous tissue under good visualization and the skin was closed on both with a 2-0 Vicryl.  Dermabond was placed  on both incisions.  Hulka tenaculum was removed and no bleeding was noted.  All counts were correct.  The patient was taken to recovery room in stable condition.   NIK D:  07/05/2023 8:38:52 am T: 07/05/2023 8:59:00 am  JOB: [id number]/ [id number]

## 2023-07-06 ENCOUNTER — Encounter (HOSPITAL_BASED_OUTPATIENT_CLINIC_OR_DEPARTMENT_OTHER): Payer: Self-pay | Admitting: Obstetrics and Gynecology

## 2023-07-06 LAB — SURGICAL PATHOLOGY

## 2023-08-22 ENCOUNTER — Encounter: Payer: Self-pay | Admitting: Family Medicine

## 2023-08-23 NOTE — Telephone Encounter (Signed)
Patient has been scheduled for 11/06/23 @ 3:20 pm and placed on wait list.

## 2023-09-03 ENCOUNTER — Ambulatory Visit (INDEPENDENT_AMBULATORY_CARE_PROVIDER_SITE_OTHER): Payer: 59 | Admitting: Surgical

## 2023-09-03 ENCOUNTER — Other Ambulatory Visit (HOSPITAL_COMMUNITY): Payer: Self-pay

## 2023-09-03 DIAGNOSIS — Z719 Counseling, unspecified: Secondary | ICD-10-CM

## 2023-09-03 MED ORDER — HYDROQUINONE 4 % EX CREA
TOPICAL_CREAM | Freq: Two times a day (BID) | CUTANEOUS | 0 refills | Status: AC
  Filled 2023-09-03: qty 28.35, 30d supply, fill #0

## 2023-09-03 NOTE — Progress Notes (Signed)
Patient is a very pleasant 42 year old female who presents via telephone visit to discuss melasma.  She has used hydroquinone 4% in the past and reports she tolerates it very well and has noticed significant improvement in the melasma on her bilateral cheeks.  She reports that she stopped using it for the past few months to give her skin a break from the hydroquinone but would like to start using it again.  She reports she has been using sunscreen and protecting her skin from the sun as well.  She reports she has seen dermatology in the past, does not recall specifically discussing melasma, but has discussed her face before.  The patient gave consent to have this visit done by telemedicine / virtual visit, two identifiers were used to identify patient. This is also consent for access the chart and treat the patient via this visit. The patient is located in [place].  I, the provider, am at the office.  We spent 5 minutes together for the visit.  Joined by telephone.  Additional prescription for hydroquinone 4% sent to patient's pharmacy.  She is aware of the recommended treatment plan  I recommend she discuss this further with dermatology when she follows up at her next appointment.  I also recommend a consult with our aesthetician to discuss possibility for improving pigmentation with other topical treatments and potentially chemical peels.  Recommend calling with questions or concerns.

## 2023-09-10 ENCOUNTER — Encounter: Payer: Self-pay | Admitting: Family Medicine

## 2023-09-10 ENCOUNTER — Ambulatory Visit (INDEPENDENT_AMBULATORY_CARE_PROVIDER_SITE_OTHER): Payer: 59 | Admitting: Family Medicine

## 2023-09-10 VITALS — BP 108/80 | HR 75 | Temp 98.0°F | Ht 66.5 in | Wt 175.0 lb

## 2023-09-10 DIAGNOSIS — E559 Vitamin D deficiency, unspecified: Secondary | ICD-10-CM | POA: Diagnosis not present

## 2023-09-10 DIAGNOSIS — Z79899 Other long term (current) drug therapy: Secondary | ICD-10-CM | POA: Diagnosis not present

## 2023-09-10 DIAGNOSIS — R5383 Other fatigue: Secondary | ICD-10-CM | POA: Diagnosis not present

## 2023-09-10 NOTE — Patient Instructions (Addendum)
Let us know when you get your flu shot at your clinic.  Please stop by lab before you go If you have mychart- we will send your results within 3 business days of [place] receiving them.  If you do not have mychart- we will call you about results within 5 business days of [place] receiving them.  *please also note that you will see labs on mychart as soon as they post. I will later go in and write notes on them- will say "notes from Dr. Durene Cal"   If labs normal likely take 2 weeks off hydrochlorothiazide and monitor blood pressure and see if makes difference in fatigue- if not we can restart (especially if blood pressure goes up)   Recommended follow up: Return for next already scheduled visit or sooner if needed.

## 2023-09-10 NOTE — Progress Notes (Signed)
Phone [phone] In person visit   Subjective:   Caitlin Christensen is a 42 y.o. year old very pleasant female patient who presents for/with See problem oriented charting Chief Complaint  Patient presents with   Fatigue    Pt here to discuss chronic fatigue that has increased the past 2 months after having her fallopian tubes removed in July. (See 8/28 mychart message)    Past Medical History-  Patient Active Problem List   Diagnosis Date Noted   Essential hypertension 04/02/2023    Priority: Medium    GERD (gastroesophageal reflux disease) 10/06/2019    Priority: Medium    Anxiety 06/20/2012    Priority: Medium    Floaters 12/15/2015    Priority: Low   Nonallopathic lesion of sacral region 08/12/2018    Priority: 1.   Nonallopathic lesion of rib cage 08/12/2018    Priority: 1.   Low back pain 06/20/2012    Priority: 1.   Nonallopathic lesion of cervical region 05/19/2011    Priority: 1.   Nonallopathic lesion of thoracic region 05/19/2011    Priority: 1.   Nonallopathic lesion of lumbosacral region 05/19/2011    Priority: 1.   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 40 tablet 1   Blood Pressure Monitoring (OMRON 3 SERIES BP MONITOR) DEVI use as directed 1 each 0   escitalopram (LEXAPRO) 10 MG tablet Take 1 tablet (10 mg total) by mouth daily. 90 tablet 3   hydrochlorothiazide (HYDRODIURIL) 12.5 MG tablet Take 1 tablet (12.5 mg total) by mouth daily. 90 tablet 3   hydroquinone 4 % cream Apply topically 2 (two) times daily. 28.35 g 0   hydrOXYzine (ATARAX) 25 MG tablet Take 1 tablet (25 mg total) by mouth 2 (two) times daily as needed for anxiety 60 tablet 3   pantoprazole (PROTONIX) 20  MG tablet Take 1 tablet (20 mg total) by mouth daily as needed. (Patient taking differently: Take 20 mg by mouth daily.) 90 tablet 3   No current facility-administered medications for this visit.     Objective:  BP 108/80   Pulse 75   Temp 98 F (36.7 C)   Ht 5' 6.5" (1.689 m)   Wt 175 lb (79.4 kg)   SpO2 98%   BMI 27.82 kg/m  Gen: NAD, resting comfortably Tympanic membrane is normal other than some scarring on the left ear drum, oropharynx largely normal No thyromegaly or cervical lymphadenopathy CV: RRR no murmurs rubs or gallops Lungs: CTAB no crackles, wheeze, rhonchi Abdomen: soft/nontender/nondistended/normal bowel sounds. No rebound or guarding.  Ext: no edema Skin: warm, dry     Assessment and Plan   # Chronic fatigue S: Patient referred to [place] on 08/22/2023 complaining of not feeling like herself for the past few months.  She was feeling tired all the time and exhausted for no reason as well as noting some mental fog.  She felt like she could not remember things as quickly and that processing took longer.  She felt like she had a hard time focusing both at work and home. -She reported staying well-hydrated with at least 80 ounces of water a day and eating superclean but even with this still gaining weight -She was walking  2 to 3 days a week and going to the gym on her day off -She did have her fallopian tubes removed on July 11 and she wonders if hormonal changes could be playing a role.  She reported blood pressure elevation on oral contraceptive pills and wanted permanent sterilization. pregnancy test negative at time of sterilzation -She was wondering about perimenopause  -She also asked about possibly starting probiotics- she reports today starting 2 weeks ago and plans to finish jar - 30 day   At this point about 3 months. She states has never eaten this clean and still gained weight. For example on fatigue had dental visit one day and was just wiped the rest of the  day . Still feeling exhausted. No pain anywhere. Denis shortness of breath or chest pain.   Only time she feels good is if has coffee with espresso. No recent COVID illness. Back to having regular periods and heavy- up to 7 days - going through 4-5 per day. Was not having periods when was on oral contraceptive pill(s) though.  A/P: 3 months of fatigue and mental fog-certainly could be perimenopausal but as of now having regular menstruation - With new onset heavy periods after recent surgery and being off oral contraceptive pills wonder about anemia - Did have macrocytosis on most recent lab and we will check B12 as well as CBC to look for anemia - Reports history of low D years ago-update vitamin D as below -Also check CMP -She is trying to improve her diet with help of Dr. Ethelene Browns videos she has been watching and is trying a probiotic from him which I think is reasonable  Blood pressure is much improved off oral contraceptive pill-suspect we may be able to remove blood pressure medicine and perhaps that could help her with her symptoms  #hypertension S: medication: Hydrochlorothiazide 12.5 mg Home readings #s: 120/70 at home BP Readings from Last 3 Encounters:  09/10/23 108/80  07/05/23 116/86  05/29/23 (!) 133/90  A/P: Blood pressure is well-controlled-hoping if labs are normal and we remove the hydrochlorothiazide after oral or contraceptive pill is removed that she may be able to maintain her blood pressure at least less than 135/85 or 140/90 and if symptoms improve perhaps stay off.  For now continue current medication   # Anxiety S:Medication: Lexapro 10 mg. Denies depressive symptoms  -alprazolam about once a week -denies new life stressors . Daughter in school now but not stressed about that.     09/10/2023    2:26 PM 04/02/2023    2:21 PM 06/11/2019    8:25 AM  GAD 7 : Generalized Anxiety Score  Nervous, Anxious, on Edge  1 0  Control/stop worrying 0 0 1  Worry too much -  different things 0 1 1  Trouble relaxing 1 0 1  Restless 0 0 0  Easily annoyed or irritable 0 0 0  Afraid - awful might happen 0 0 0  Total GAD 7 Score  2 3  Anxiety Difficulty Not difficult at all Not difficult at all Not difficult at all  A/P: Anxiety very well-controlled-does not appear to be contributing to symptoms-continue current medication  Recommended follow up: Return for next already scheduled visit or sooner if needed. Future Appointments  Date Time Provider Department Center  12/31/2023  8:00 AM Shelva Majestic, MD [facility]    Lab/Order associations:   ICD-10-CM   1. Fatigue, unspecified type  R53.83 Vitamin B12    TSH  CBC with Differential/Platelet    Comprehensive metabolic panel    2. High risk medication use  Z79.899 Vitamin B12    3. Vitamin D deficiency  E55.9 VITAMIN D 25 Hydroxy (Vit-D Deficiency, Fractures)      No orders of the defined types were placed in this encounter.   Return precautions advised.  Tana Conch, MD

## 2023-09-11 ENCOUNTER — Other Ambulatory Visit: Payer: Self-pay | Admitting: Family Medicine

## 2023-09-11 ENCOUNTER — Other Ambulatory Visit (HOSPITAL_COMMUNITY): Payer: Self-pay

## 2023-09-11 DIAGNOSIS — E559 Vitamin D deficiency, unspecified: Secondary | ICD-10-CM | POA: Insufficient documentation

## 2023-09-11 LAB — CBC WITH DIFFERENTIAL/PLATELET
Basophils Absolute: 0.1 10*3/uL (ref 0.0–0.1)
Basophils Relative: 1.2 % (ref 0.0–3.0)
Eosinophils Absolute: 0.2 10*3/uL (ref 0.0–0.7)
Eosinophils Relative: 2.5 % (ref 0.0–5.0)
HCT: 39.5 % (ref 36.0–46.0)
Hemoglobin: 13 g/dL (ref 12.0–15.0)
Lymphocytes Relative: 40.3 % (ref 12.0–46.0)
Lymphs Abs: 2.8 10*3/uL (ref 0.7–4.0)
MCHC: 32.8 g/dL (ref 30.0–36.0)
MCV: 92.1 fl (ref 78.0–100.0)
Monocytes Absolute: 0.6 10*3/uL (ref 0.1–1.0)
Monocytes Relative: 8.4 % (ref 3.0–12.0)
Neutro Abs: 3.3 10*3/uL (ref 1.4–7.7)
Neutrophils Relative %: 47.6 % (ref 43.0–77.0)
Platelets: 328 10*3/uL (ref 150.0–400.0)
RBC: 4.29 Mil/uL (ref 3.87–5.11)
RDW: 12.7 % (ref 11.5–15.5)
WBC: 7 10*3/uL (ref 4.0–10.5)

## 2023-09-11 LAB — COMPREHENSIVE METABOLIC PANEL
ALT: 14 U/L (ref 0–35)
AST: 18 U/L (ref 0–37)
Albumin: 4.2 g/dL (ref 3.5–5.2)
Alkaline Phosphatase: 48 U/L (ref 39–117)
BUN: 18 mg/dL (ref 6–23)
CO2: 25 meq/L (ref 19–32)
Calcium: 9.1 mg/dL (ref 8.4–10.5)
Chloride: 102 meq/L (ref 96–112)
Creatinine, Ser: 0.82 mg/dL (ref 0.40–1.20)
GFR: 88.13 mL/min (ref >60.00)
Glucose, Bld: 78 mg/dL (ref 70–99)
Potassium: 3.8 meq/L (ref 3.5–5.1)
Sodium: 135 meq/L (ref 135–145)
Total Bilirubin: 0.3 mg/dL (ref 0.2–1.2)
Total Protein: 7.2 g/dL (ref 6.0–8.3)

## 2023-09-11 LAB — TSH: TSH: 0.92 u[IU]/mL (ref 0.35–5.50)

## 2023-09-11 LAB — VITAMIN B12: Vitamin B-12: 371 pg/mL (ref 211–911)

## 2023-09-11 LAB — VITAMIN D 25 HYDROXY (VIT D DEFICIENCY, FRACTURES): VITD: 16.77 ng/mL — ABNORMAL LOW (ref 30.00–100.00)

## 2023-09-11 MED ORDER — VITAMIN D (ERGOCALCIFEROL) 1.25 MG (50000 UNIT) PO CAPS
50000.0000 [IU] | ORAL_CAPSULE | ORAL | 0 refills | Status: DC
  Filled 2023-09-11 – 2023-09-12 (×2): qty 12, 84d supply, fill #0

## 2023-09-12 ENCOUNTER — Other Ambulatory Visit (HOSPITAL_COMMUNITY): Payer: Self-pay

## 2023-10-16 ENCOUNTER — Other Ambulatory Visit: Payer: Self-pay

## 2023-11-06 ENCOUNTER — Ambulatory Visit: Payer: 59 | Admitting: Family Medicine

## 2023-12-31 ENCOUNTER — Encounter: Payer: 59 | Admitting: Family Medicine

## 2024-01-03 ENCOUNTER — Other Ambulatory Visit: Payer: Self-pay | Admitting: Family Medicine

## 2024-01-03 ENCOUNTER — Other Ambulatory Visit (HOSPITAL_COMMUNITY): Payer: Self-pay

## 2024-01-03 MED ORDER — PANTOPRAZOLE SODIUM 20 MG PO TBEC
20.0000 mg | DELAYED_RELEASE_TABLET | Freq: Every day | ORAL | 3 refills | Status: DC | PRN
  Filled 2024-01-03: qty 90, 90d supply, fill #0
  Filled 2024-03-27: qty 90, 90d supply, fill #1
  Filled 2024-06-30: qty 90, 90d supply, fill #2
  Filled 2024-10-01 – 2024-10-15 (×2): qty 90, 90d supply, fill #3

## 2024-01-08 ENCOUNTER — Other Ambulatory Visit (HOSPITAL_COMMUNITY): Payer: Self-pay

## 2024-01-08 ENCOUNTER — Ambulatory Visit (INDEPENDENT_AMBULATORY_CARE_PROVIDER_SITE_OTHER): Payer: Commercial Managed Care - PPO | Admitting: Family Medicine

## 2024-01-08 ENCOUNTER — Encounter: Payer: Self-pay | Admitting: Family Medicine

## 2024-01-08 ENCOUNTER — Other Ambulatory Visit: Payer: Self-pay | Admitting: Family Medicine

## 2024-01-08 VITALS — BP 111/79 | HR 80 | Temp 97.2°F | Ht 66.5 in | Wt 173.6 lb

## 2024-01-08 DIAGNOSIS — Z13220 Encounter for screening for lipoid disorders: Secondary | ICD-10-CM | POA: Diagnosis not present

## 2024-01-08 DIAGNOSIS — I10 Essential (primary) hypertension: Secondary | ICD-10-CM | POA: Diagnosis not present

## 2024-01-08 DIAGNOSIS — E559 Vitamin D deficiency, unspecified: Secondary | ICD-10-CM

## 2024-01-08 DIAGNOSIS — Z0000 Encounter for general adult medical examination without abnormal findings: Secondary | ICD-10-CM

## 2024-01-08 LAB — COMPREHENSIVE METABOLIC PANEL
ALT: 15 U/L (ref 0–35)
AST: 18 U/L (ref 0–37)
Albumin: 4.5 g/dL (ref 3.5–5.2)
Alkaline Phosphatase: 45 U/L (ref 39–117)
BUN: 13 mg/dL (ref 6–23)
CO2: 25 meq/L (ref 19–32)
Calcium: 9.1 mg/dL (ref 8.4–10.5)
Chloride: 106 meq/L (ref 96–112)
Creatinine, Ser: 0.68 mg/dL (ref 0.40–1.20)
GFR: 107.06 mL/min (ref >60.00)
Glucose, Bld: 77 mg/dL (ref 70–99)
Potassium: 3.8 meq/L (ref 3.5–5.1)
Sodium: 138 meq/L (ref 135–145)
Total Bilirubin: 0.4 mg/dL (ref 0.2–1.2)
Total Protein: 7.4 g/dL (ref 6.0–8.3)

## 2024-01-08 LAB — CBC WITH DIFFERENTIAL/PLATELET
Basophils Absolute: 0 10*3/uL (ref 0.0–0.1)
Basophils Relative: 0.6 % (ref 0.0–3.0)
Eosinophils Absolute: 0.1 10*3/uL (ref 0.0–0.7)
Eosinophils Relative: 2.8 % (ref 0.0–5.0)
HCT: 38.6 % (ref 36.0–46.0)
Hemoglobin: 12.8 g/dL (ref 12.0–15.0)
Lymphocytes Relative: 42.1 % (ref 12.0–46.0)
Lymphs Abs: 2.1 10*3/uL (ref 0.7–4.0)
MCHC: 33.2 g/dL (ref 30.0–36.0)
MCV: 91.8 fL (ref 78.0–100.0)
Monocytes Absolute: 0.5 10*3/uL (ref 0.1–1.0)
Monocytes Relative: 9.9 % (ref 3.0–12.0)
Neutro Abs: 2.2 10*3/uL (ref 1.4–7.7)
Neutrophils Relative %: 44.6 % (ref 43.0–77.0)
Platelets: 377 10*3/uL (ref 150.0–400.0)
RBC: 4.2 Mil/uL (ref 3.87–5.11)
RDW: 13.5 % (ref 11.5–15.5)
WBC: 5 10*3/uL (ref 4.0–10.5)

## 2024-01-08 LAB — VITAMIN D 25 HYDROXY (VIT D DEFICIENCY, FRACTURES): VITD: 21.38 ng/mL — ABNORMAL LOW (ref 30.00–100.00)

## 2024-01-08 LAB — LIPID PANEL
Cholesterol: 252 mg/dL — ABNORMAL HIGH (ref 0–200)
HDL: 54.9 mg/dL (ref >39.00)
LDL Cholesterol: 167 mg/dL — ABNORMAL HIGH (ref 0–99)
NonHDL: 196.83
Total CHOL/HDL Ratio: 5
Triglycerides: 148 mg/dL (ref 0.0–149.0)
VLDL: 29.6 mg/dL (ref 0.0–40.0)

## 2024-01-08 MED ORDER — HYDROXYZINE HCL 25 MG PO TABS
25.0000 mg | ORAL_TABLET | Freq: Two times a day (BID) | ORAL | 3 refills | Status: AC | PRN
  Filled 2024-01-08: qty 60, 30d supply, fill #0
  Filled 2024-04-16: qty 60, 30d supply, fill #1
  Filled 2024-06-30: qty 60, 30d supply, fill #2

## 2024-01-08 MED ORDER — ALPRAZOLAM 0.25 MG PO TABS
0.2500 mg | ORAL_TABLET | Freq: Two times a day (BID) | ORAL | 1 refills | Status: DC | PRN
Start: 2024-01-08 — End: 2024-07-02
  Filled 2024-01-08: qty 40, 20d supply, fill #0
  Filled 2024-03-17: qty 40, 20d supply, fill #1

## 2024-01-08 MED ORDER — ESCITALOPRAM OXALATE 10 MG PO TABS
10.0000 mg | ORAL_TABLET | Freq: Every day | ORAL | 3 refills | Status: DC
  Filled 2024-01-08: qty 90, 90d supply, fill #0
  Filled 2024-04-16: qty 90, 90d supply, fill #1
  Filled 2024-07-02: qty 90, 90d supply, fill #2
  Filled 2024-10-12: qty 90, 90d supply, fill #3

## 2024-01-08 MED ORDER — VITAMIN D (ERGOCALCIFEROL) 1.25 MG (50000 UNIT) PO CAPS
50000.0000 [IU] | ORAL_CAPSULE | ORAL | 1 refills | Status: DC
  Filled 2024-01-08 – 2024-01-10 (×2): qty 12, 84d supply, fill #0
  Filled 2024-04-16: qty 12, 84d supply, fill #1

## 2024-01-08 MED ORDER — HYDROCHLOROTHIAZIDE 12.5 MG PO TABS
12.5000 mg | ORAL_TABLET | Freq: Every day | ORAL | 3 refills | Status: DC
  Filled 2024-01-08 – 2024-03-27 (×2): qty 90, 90d supply, fill #0
  Filled 2024-06-30: qty 90, 90d supply, fill #1
  Filled 2024-10-01 – 2024-10-15 (×2): qty 90, 90d supply, fill #2

## 2024-01-08 NOTE — Patient Instructions (Addendum)
 Please stop by lab before you go If you have mychart- we will send your results within 3 business days of us  receiving them.  If you do not have mychart- we will call you about results within 5 business days of us  receiving them.  *please also note that you will see labs on mychart as soon as they post. I will later go in and write notes on them- will say notes from Dr. Katrinka   Keep working on healthy eating and regular exercise and weight loss  Recommended follow up: Return in about 6 months (around 07/07/2024) for followup or sooner if needed.Schedule b4 you leave.

## 2024-01-08 NOTE — Progress Notes (Signed)
 Phone [phone]   Subjective:  Patient presents today for their annual physical. Chief complaint-noted.   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   Nonallopathic lesion of rib cage 08/12/2018    Priority: 1.   Low back pain 06/20/2012    Priority: 1.   Nonallopathic lesion of cervical region 05/19/2011    Priority: 1.   Nonallopathic lesion of thoracic region 05/19/2011    Priority: 1.   Nonallopathic lesion of lumbosacral region 05/19/2011    Priority: 1.   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;  Service: Gynecology;  Laterality: Bilateral;   LIPOSUCTION MULTIPLE BODY PARTS     Stomach and hips   NASAL SEPTOPLASTY W/  TURBINOPLASTY Bilateral 05/29/2023   Procedure: NASAL SEPTOPLASTY WITH TURBINATE REDUCTION;  Surgeon: Carlie Clark, MD;  Location: [facility];  Service: ENT;  Laterality: Bilateral;   typanostomy tubes     as baby   WISDOM TOOTH EXTRACTION  1999    Family History  Problem Relation Age of Onset   Retinal detachment Mother    Arthritis Father    Hearing loss Father    Healthy Sister    Stroke Maternal Grandmother        on hospice in late 2023- bad infection in foot   Kidney disease Maternal Grandfather        died from this at 36. pacemaker   Other Paternal Grandmother        lived to 70   Other Paternal Grandfather        lived to 50   Breast cancer Other        grandfathers sister mid to late 60s   Diabetes Neg Hx    Drug abuse Neg Hx    Early death Neg Hx    Heart disease Neg Hx    Hyperlipidemia Neg Hx    Hypertension Neg Hx    Cancer Neg Hx     Medications- reviewed and updated Current Outpatient Medications  Medication Sig Dispense Refill   ALPRAZolam  (XANAX ) 0.25 MG tablet Take 1 tablet (0.25 mg total) by mouth 2 (two) times daily as needed for anxiety or sleep (do not  drive for 8 hours after taking). 40 tablet 1   escitalopram  (LEXAPRO ) 10 MG tablet Take 1 tablet (10 mg total) by mouth daily. 90 tablet 3   hydrochlorothiazide  (HYDRODIURIL ) 12.5 MG tablet Take 1 tablet (12.5 mg total) by mouth daily. 90 tablet 3   hydroquinone  4 % cream Apply topically 2 (two) times daily. 28.35 g 0   hydrOXYzine  (ATARAX ) 25 MG tablet Take 1 tablet (25 mg total) by mouth 2 (two) times daily as needed for anxiety 60 tablet 3   pantoprazole  (PROTONIX ) 20 MG tablet Take 1 tablet (20 mg total) by mouth daily as needed. 90 tablet 3   Vitamin D , Ergocalciferol , (DRISDOL ) 1.25 MG (50000 UNIT) CAPS capsule Take 1 capsule (50,000 Units total) by mouth every 7 (seven) days. 12 capsule 0   No current facility-administered medications for this visit.    Allergies-reviewed and  updated Allergies  Allergen Reactions   Seasonique [Levonorgestrel-Ethinyl Estrad] Hives   Keflex  [Cephalexin ]     hives    Social History   Social History Narrative   Married to [place]. Daughter Maurilio born 06/25/2015- dyslexia and will start at [facility] January 2023.  Has a dog.       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 Enjoys being active, outdoor activities, hiking   Objective  Objective:  BP 111/79   Pulse 80   Temp (!) 97.2 F (36.2 C)   Ht 5' 6.5 (1.689 m)   Wt 173 lb 9.6 oz (78.7 kg)   SpO2 100%   BMI 27.60 kg/m  Gen: NAD, resting comfortably HEENT: Mucous membranes are moist. Oropharynx normal, scaring on left and normal on right tympanic membrane  Neck: no thyromegaly CV: RRR no murmurs rubs or gallops Lungs: CTAB no crackles, wheeze, rhonchi Abdomen: soft/nontender/nondistended/normal bowel sounds. No rebound or guarding.  Ext: no edema Skin: warm, dry Neuro: grossly normal, moves all extremities, PERRLA   Assessment and Plan   43 y.o. female presenting for annual physical.  Health Maintenance counseling: 1. Anticipatory guidance: Patient counseled regarding regular dental exams -q6 months, eye exams-regular follow-up with history of floaters in mom's history of retinal detachment,  avoiding smoking and second hand smoke , limiting alcohol to 1 beverage per day- very rare , no illicit drugs .   2. Risk factor reduction:  Advised patient of need for regular exercise and diet rich and fruits and vegetables to reduce risk of heart attack and stroke.  Exercise-last year was doing 2 to 3 days a week at [employer] up to 1.5 hours or walking for 45 minutes- tough in December but plans to restart .  Diet/weight management-weight up 6 pounds in the last year.  Has seen bariatric clinic in the past.  We wonder if Lexapro  has at least mildly contributed. Trying to increase protein  and fiber and reduce caloric intake- slightly down at home from holidays.  Wt Readings from Last 3 Encounters:  01/08/24 173 lb 9.6 oz (78.7 kg)  09/10/23 175 lb (79.4 kg)  07/05/23 170 lb 9.6 oz (77.4 kg)  3. Immunizations/screenings/ancillary studies-up-to-date other than holding off on COVID-19 vaccination, flu shot already had an team will input Immunization History  Administered Date(s) Administered   Influenza Whole 10/03/2012, 10/03/2021   Influenza,inj,Quad PF,6+ Mos 09/09/2019, 10/08/2022   Influenza-Unspecified 09/25/2015, 09/25/2016, 09/17/2017, 09/25/2023   MMR 06/27/2015   PFIZER(Purple Top)SARS-COV-2 Vaccination 12/20/2019,  01/10/2020   Tdap 09/15/2008, 12/06/2015   4. Cervical cancer screening-sees physician for women's 03/02/2021 last Pap with plan for 3-year follow-up- has another in the spring 5. Breast cancer screening-  breast exam with GYN and mammogram 05/07/2023.  Genetic testing with 28% lifetime risk and gets 3D mammograms 6. Colon cancer screening -  no family history, start at age 54  7. Skin cancer screening-sees Dr. Robinson if needed. advised regular sunscreen use. Denies worrisome, changing, or new skin lesions.  8. Birth control/STD check- monogamous and tubes removed-salpingectomy 07/05/2023 9. Osteoporosis screening at 65-we will plan on this 10. Smoking associated screening -never smoker  Status of chronic or acute concerns   # Chronic fatigue-reported in September 2024-also complaining of mental fog.  Did have low vitamin D  and has been treated.  She has been trying to improve her diet.  We wondered if lower blood pressure was contributing.  CBC, CMP, TSH, B12 reassuring though would prefer B12 over 400 and recommended at least once weekly on an ongoing basis -thankfully fatigue has improved on D  #hypertension S: medication: Hydrochlorothiazide  12.5 mg BP Readings from Last 3 Encounters:  01/08/24 111/79  09/10/23 108/80  07/05/23 116/86  A/P: stable-  continue current medicines   # Anxiety S:Medication: Escitalopram /Lexapro  10 mg, alprazolam  about once a week or more- other than holidays and higher load and husband needing surgery.  Also has hydroxyzine  available if needed- uses most weeks.  - Denies depressive symptoms -No suicidal ideation     01/08/2024    8:46 AM 09/10/2023    2:26 PM 04/02/2023    2:21 PM 06/11/2019    8:25 AM  GAD 7 : Generalized Anxiety Score  Nervous, Anxious, on Edge 0  1 0  Control/stop worrying 1 0 0 1  Worry too much - different things 0 0 1 1  Trouble relaxing 1 1 0 1  Restless 0 0 0 0  Easily annoyed or irritable 0 0 0 0  Afraid - awful might happen 0 0 0 0  Total GAD 7 Score 2  2 3   Anxiety Difficulty Not difficult at all Not difficult at all Not difficult at all Not difficult at all   A/P: reasonable control- continue current medications     #Vitamin D  deficiency-as low as 16 in 2024 S: Medication: 5000 IU A/P: suspect improved- update today  #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  Lab/Order associations: fasting   ICD-10-CM   1. Preventative health care  Z00.00     2. Screening for hyperlipidemia  Z13.220 Comprehensive metabolic panel    Lipid panel    3. Essential hypertension  I10 CBC with Differential/Platelet    4. Vitamin D  deficiency  E55.9 VITAMIN D  25 Hydroxy (Vit-D Deficiency, Fractures)      No orders of the defined types were placed in this encounter.   Return precautions advised.  Garnette Lukes, MD

## 2024-01-09 ENCOUNTER — Other Ambulatory Visit (HOSPITAL_COMMUNITY): Payer: Self-pay

## 2024-01-10 ENCOUNTER — Other Ambulatory Visit (HOSPITAL_COMMUNITY): Payer: Self-pay

## 2024-01-15 ENCOUNTER — Other Ambulatory Visit (HOSPITAL_COMMUNITY): Payer: Self-pay

## 2024-01-29 ENCOUNTER — Telehealth: Payer: Commercial Managed Care - PPO | Admitting: Family Medicine

## 2024-01-29 ENCOUNTER — Encounter: Payer: Self-pay | Admitting: Family Medicine

## 2024-01-29 ENCOUNTER — Other Ambulatory Visit (HOSPITAL_COMMUNITY): Payer: Self-pay

## 2024-01-29 VITALS — Ht 66.5 in | Wt 170.0 lb

## 2024-01-29 DIAGNOSIS — N3000 Acute cystitis without hematuria: Secondary | ICD-10-CM

## 2024-01-29 DIAGNOSIS — R300 Dysuria: Secondary | ICD-10-CM | POA: Diagnosis not present

## 2024-01-29 MED ORDER — SULFAMETHOXAZOLE-TRIMETHOPRIM 800-160 MG PO TABS
1.0000 | ORAL_TABLET | Freq: Two times a day (BID) | ORAL | 0 refills | Status: AC
  Filled 2024-01-29: qty 14, 7d supply, fill #0

## 2024-01-29 NOTE — Patient Instructions (Addendum)
 Patient on video visit with symptoms consistent with acute cystitis/urinary tract infection (UTI) .  We discussed ideally collecting urine culture on 1 hand to make sure we have the appropriate antibiotic but on the other hand not wanting to wait to start treatment-she is going to pick up antibiotic Bactrim  double strength for 7 days tonight but will not start taking this until she collect a urine at home and places in her fridge.  She will bring by urine in the morning for urinalysis and urine culture  Recommended follow up: Return for as needed for new, worsening, persistent symptoms.

## 2024-01-29 NOTE — Progress Notes (Signed)
 Phone [phone] Virtual visit via Video note   Subjective:  Chief complaint: Chief Complaint  Patient presents with   Urinary Tract Infection    Pt c/o of urinary sxs since the weekend that consists of urgency with urination, bladder spasms and burning with urination. No blood in urine, pt has tried OTC Azo, and cranberry juice that has not helped. Pt has hx of UTI's.     Our team/I connected with Tawni JINNY Ill at  3:40 PM EST by a video enabled telemedicine application (caregility through epic) and verified that I am speaking with the correct person using two identifiers. Our team/I discussed the limitations of evaluation and management by telemedicine and the availability of in person appointments.No physical exam was performed (except for noted visual exam or audio findings with Telehealth visits).   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   Nonallopathic lesion of rib cage 08/12/2018    Priority: 1.   Low back pain 06/20/2012    Priority: 1.   Nonallopathic lesion of cervical region 05/19/2011    Priority: 1.   Nonallopathic lesion of thoracic region 05/19/2011    Priority: 1.   Nonallopathic lesion of lumbosacral region 05/19/2011    Priority: 1.    Medications- reviewed and updated Current Outpatient Medications  Medication Sig Dispense Refill   ALPRAZolam  (XANAX ) 0.25 MG tablet Take 1 tablet  (0.25 mg total) by mouth 2 (two) times daily as needed for anxiety or sleep (do not drive for 8 hours after taking). 40 tablet 1   escitalopram  (LEXAPRO ) 10 MG tablet Take 1 tablet (10 mg total) by mouth daily. 90 tablet 3   hydrochlorothiazide  (HYDRODIURIL ) 12.5 MG tablet Take 1 tablet (12.5 mg total) by mouth daily. 90 tablet 3   hydroquinone  4 % cream Apply topically 2 (two) times daily. 28.35 g 0   hydrOXYzine  (ATARAX ) 25 MG tablet Take 1 tablet (25 mg total) by mouth 2 (two) times daily as needed for anxiety 60 tablet 3   pantoprazole  (PROTONIX ) 20 MG tablet Take 1 tablet (20 mg total) by mouth daily as needed. 90 tablet 3   sulfamethoxazole -trimethoprim  (BACTRIM  DS) 800-160 MG tablet Take 1 tablet by mouth 2 (two) times daily for 7 days. 14 tablet 0   vitamin B-12 (CYANOCOBALAMIN ) 100 MCG tablet Take 100 mcg by mouth daily. Pt unsure of dose.     Vitamin D , Ergocalciferol , (DRISDOL ) 1.25 MG (50000 UNIT) CAPS capsule Take 1 capsule (50,000 Units total) by mouth every 7 (seven) days. 13 capsule 1   No current facility-administered medications for this visit.     Objective:  Ht 5' 6.5 (1.689 m)   Wt 170 lb (77.1 kg)   BMI 27.03 kg/m  self reported vitals Gen: NAD, resting comfortably Lungs: nonlabored, normal respiratory rate  Skin: appears dry, no obvious rash     Assessment and Plan   #Concern for  UTI S: Patients symptoms started over the weekend.  Complains of bladder spasm/lower abdominal pain, dysuria: YES; polyuria: YES; nocturia: YES; urgency: YES and low volume with recurrent need to go.  Has tried OTC AZO and cranberry juice without help Symptoms are worsening some.  UTIs in 20's and used ciprofloxacin and pyridium then ROS- no fever, chills, nausea, vomiting reported . no blood in urine.   A/P: Patient on video visit with symptoms consistent with acute cystitis.  We discussed ideally collecting urine culture on 1 hand but on the other hand not wanting to wait to start  treatment-she is going to pick up antibiotic Bactrim  double strength for 7 days tonight but will not start taking this until she collect a urine at home and places in her fridge.  She will bring by urine in the morning for urinalysis and urine culture  Recommended follow up: Return for as needed for new, worsening, persistent symptoms. Future Appointments  Date Time Provider Department Center  07/10/2024  9:00 AM Katrinka Garnette KIDD, MD [facility]    Lab/Order associations:   ICD-10-CM   1. Acute cystitis without hematuria  N30.00     2. Dysuria  R30.0 Urine Culture    Urinalysis, Routine w reflex microscopic      Meds ordered this encounter  Medications   sulfamethoxazole -trimethoprim  (BACTRIM  DS) 800-160 MG tablet    Sig: Take 1 tablet by mouth 2 (two) times daily for 7 days.    Dispense:  14 tablet    Refill:  0    Return precautions advised.  Garnette Katrinka, MD

## 2024-01-30 ENCOUNTER — Other Ambulatory Visit (INDEPENDENT_AMBULATORY_CARE_PROVIDER_SITE_OTHER): Payer: Commercial Managed Care - PPO

## 2024-01-30 DIAGNOSIS — R300 Dysuria: Secondary | ICD-10-CM | POA: Diagnosis not present

## 2024-01-30 LAB — URINALYSIS, ROUTINE W REFLEX MICROSCOPIC
Bilirubin Urine: NEGATIVE
Ketones, ur: NEGATIVE
Nitrite: POSITIVE — AB
Specific Gravity, Urine: 1.01 (ref 1.000–1.030)
Total Protein, Urine: NEGATIVE
Urine Glucose: NEGATIVE
Urobilinogen, UA: 0.2 (ref 0.0–1.0)
pH: 6 (ref 5.0–8.0)

## 2024-01-31 ENCOUNTER — Encounter: Payer: Self-pay | Admitting: Family Medicine

## 2024-02-02 LAB — URINE CULTURE
MICRO NUMBER:: 16045093
SPECIMEN QUALITY:: ADEQUATE

## 2024-02-04 ENCOUNTER — Encounter: Payer: Self-pay | Admitting: Family Medicine

## 2024-03-18 ENCOUNTER — Other Ambulatory Visit: Payer: Self-pay

## 2024-03-20 DIAGNOSIS — Z9189 Other specified personal risk factors, not elsewhere classified: Secondary | ICD-10-CM | POA: Diagnosis not present

## 2024-03-20 DIAGNOSIS — Z6823 Body mass index (BMI) 23.0-23.9, adult: Secondary | ICD-10-CM | POA: Diagnosis not present

## 2024-03-20 DIAGNOSIS — Z01419 Encounter for gynecological examination (general) (routine) without abnormal findings: Secondary | ICD-10-CM | POA: Diagnosis not present

## 2024-03-20 DIAGNOSIS — Z124 Encounter for screening for malignant neoplasm of cervix: Secondary | ICD-10-CM | POA: Diagnosis not present

## 2024-03-20 LAB — HM PAP SMEAR

## 2024-03-27 ENCOUNTER — Other Ambulatory Visit (HOSPITAL_COMMUNITY): Payer: Self-pay

## 2024-04-11 ENCOUNTER — Other Ambulatory Visit: Payer: Self-pay | Admitting: Obstetrics and Gynecology

## 2024-04-11 DIAGNOSIS — Z1231 Encounter for screening mammogram for malignant neoplasm of breast: Secondary | ICD-10-CM

## 2024-04-30 ENCOUNTER — Encounter (HOSPITAL_COMMUNITY): Payer: Self-pay

## 2024-05-07 ENCOUNTER — Ambulatory Visit
Admission: RE | Admit: 2024-05-07 | Discharge: 2024-05-07 | Disposition: A | Discharge status: Home / Self Care | Source: Ambulatory Visit | Attending: Obstetrics and Gynecology | Admitting: Obstetrics and Gynecology

## 2024-05-07 DIAGNOSIS — Z1231 Encounter for screening mammogram for malignant neoplasm of breast: Secondary | ICD-10-CM

## 2024-07-02 ENCOUNTER — Other Ambulatory Visit: Payer: Self-pay | Admitting: Family Medicine

## 2024-07-02 ENCOUNTER — Other Ambulatory Visit (HOSPITAL_COMMUNITY): Payer: Self-pay

## 2024-07-02 ENCOUNTER — Other Ambulatory Visit: Payer: Self-pay

## 2024-07-03 ENCOUNTER — Other Ambulatory Visit (HOSPITAL_COMMUNITY): Payer: Self-pay

## 2024-07-03 MED ORDER — ALPRAZOLAM 0.25 MG PO TABS
0.2500 mg | ORAL_TABLET | Freq: Two times a day (BID) | ORAL | 1 refills | Status: AC | PRN
Start: 2024-07-03 — End: ?
  Filled 2024-07-03: qty 40, 20d supply, fill #0
  Filled 2024-10-28: qty 40, 20d supply, fill #1

## 2024-07-04 ENCOUNTER — Other Ambulatory Visit (HOSPITAL_COMMUNITY): Payer: Self-pay

## 2024-07-10 ENCOUNTER — Ambulatory Visit: Payer: Commercial Managed Care - PPO | Admitting: Family Medicine

## 2024-10-13 ENCOUNTER — Other Ambulatory Visit (HOSPITAL_COMMUNITY): Payer: Self-pay

## 2024-10-15 ENCOUNTER — Other Ambulatory Visit (HOSPITAL_COMMUNITY): Payer: Self-pay

## 2024-10-28 ENCOUNTER — Other Ambulatory Visit: Payer: Self-pay

## 2024-11-25 ENCOUNTER — Other Ambulatory Visit: Payer: Self-pay

## 2025-01-14 ENCOUNTER — Other Ambulatory Visit: Payer: Self-pay | Admitting: Family Medicine

## 2025-01-15 ENCOUNTER — Other Ambulatory Visit: Payer: Self-pay

## 2025-01-15 ENCOUNTER — Encounter (HOSPITAL_COMMUNITY): Payer: Self-pay

## 2025-01-15 ENCOUNTER — Other Ambulatory Visit (HOSPITAL_COMMUNITY): Payer: Self-pay

## 2025-01-15 MED ORDER — PANTOPRAZOLE SODIUM 20 MG PO TBEC
20.0000 mg | DELAYED_RELEASE_TABLET | Freq: Every day | ORAL | 3 refills | Status: AC | PRN
  Filled 2025-01-15 – 2025-01-16 (×2): qty 90, 90d supply, fill #0

## 2025-01-15 MED ORDER — HYDROCHLOROTHIAZIDE 12.5 MG PO TABS
12.5000 mg | ORAL_TABLET | Freq: Every day | ORAL | 3 refills | Status: AC
  Filled 2025-01-15 – 2025-01-16 (×2): qty 90, 90d supply, fill #0

## 2025-01-15 MED ORDER — ESCITALOPRAM OXALATE 10 MG PO TABS
10.0000 mg | ORAL_TABLET | Freq: Every day | ORAL | 3 refills | Status: AC
  Filled 2025-01-15 – 2025-01-16 (×2): qty 90, 90d supply, fill #0

## 2025-01-16 ENCOUNTER — Other Ambulatory Visit (HOSPITAL_COMMUNITY): Payer: Self-pay

## 2025-01-23 ENCOUNTER — Ambulatory Visit (INDEPENDENT_AMBULATORY_CARE_PROVIDER_SITE_OTHER): Admitting: Family Medicine

## 2025-01-23 ENCOUNTER — Ambulatory Visit: Payer: Self-pay | Admitting: Family Medicine

## 2025-01-23 ENCOUNTER — Encounter: Payer: Self-pay | Admitting: Family Medicine

## 2025-01-23 VITALS — BP 118/80 | HR 78 | Temp 97.6°F | Ht 66.5 in | Wt 178.6 lb

## 2025-01-23 DIAGNOSIS — Z13220 Encounter for screening for lipoid disorders: Secondary | ICD-10-CM

## 2025-01-23 DIAGNOSIS — E538 Deficiency of other specified B group vitamins: Secondary | ICD-10-CM | POA: Diagnosis not present

## 2025-01-23 DIAGNOSIS — I10 Essential (primary) hypertension: Secondary | ICD-10-CM | POA: Diagnosis not present

## 2025-01-23 DIAGNOSIS — Z0000 Encounter for general adult medical examination without abnormal findings: Secondary | ICD-10-CM

## 2025-01-23 DIAGNOSIS — E559 Vitamin D deficiency, unspecified: Secondary | ICD-10-CM | POA: Diagnosis not present

## 2025-01-23 LAB — CBC WITH DIFFERENTIAL/PLATELET
Basophils Absolute: 0 10*3/uL (ref 0.0–0.1)
Basophils Relative: 0.6 % (ref 0.0–3.0)
Eosinophils Absolute: 0.1 10*3/uL (ref 0.0–0.7)
Eosinophils Relative: 1.2 % (ref 0.0–5.0)
HCT: 39.4 % (ref 36.0–46.0)
Hemoglobin: 13.5 g/dL (ref 12.0–15.0)
Lymphocytes Relative: 36.3 % (ref 12.0–46.0)
Lymphs Abs: 2.3 10*3/uL (ref 0.7–4.0)
MCHC: 34.2 g/dL (ref 30.0–36.0)
MCV: 88.8 fl (ref 78.0–100.0)
Monocytes Absolute: 0.5 10*3/uL (ref 0.1–1.0)
Monocytes Relative: 8.5 % (ref 3.0–12.0)
Neutro Abs: 3.3 10*3/uL (ref 1.4–7.7)
Neutrophils Relative %: 53.4 % (ref 43.0–77.0)
Platelets: 332 10*3/uL (ref 150.0–400.0)
RBC: 4.44 Mil/uL (ref 3.87–5.11)
RDW: 12.8 % (ref 11.5–15.5)
WBC: 6.2 10*3/uL (ref 4.0–10.5)

## 2025-01-23 LAB — COMPREHENSIVE METABOLIC PANEL WITH GFR
ALT: 14 U/L (ref 3–35)
AST: 16 U/L (ref 5–37)
Albumin: 4.4 g/dL (ref 3.5–5.2)
Alkaline Phosphatase: 40 U/L (ref 39–117)
BUN: 12 mg/dL (ref 6–23)
CO2: 24 meq/L (ref 19–32)
Calcium: 9.1 mg/dL (ref 8.4–10.5)
Chloride: 102 meq/L (ref 96–112)
Creatinine, Ser: 0.62 mg/dL (ref 0.40–1.20)
GFR: 108.68 mL/min
Glucose, Bld: 87 mg/dL (ref 70–99)
Potassium: 4.2 meq/L (ref 3.5–5.1)
Sodium: 134 meq/L — ABNORMAL LOW (ref 135–145)
Total Bilirubin: 0.6 mg/dL (ref 0.2–1.2)
Total Protein: 7.5 g/dL (ref 6.0–8.3)

## 2025-01-23 LAB — LIPID PANEL
Cholesterol: 227 mg/dL — ABNORMAL HIGH (ref 28–200)
HDL: 58.7 mg/dL
LDL Cholesterol: 149 mg/dL — ABNORMAL HIGH (ref 10–99)
NonHDL: 167.89
Total CHOL/HDL Ratio: 4
Triglycerides: 95 mg/dL (ref 10.0–149.0)
VLDL: 19 mg/dL (ref 0.0–40.0)

## 2025-01-23 LAB — VITAMIN B12: Vitamin B-12: 421 pg/mL (ref 211–911)

## 2025-01-23 LAB — VITAMIN D 25 HYDROXY (VIT D DEFICIENCY, FRACTURES): VITD: 22.26 ng/mL — ABNORMAL LOW (ref 30.00–100.00)

## 2025-01-23 MED ORDER — VITAMIN D (ERGOCALCIFEROL) 1.25 MG (50000 UNIT) PO CAPS
50000.0000 [IU] | ORAL_CAPSULE | ORAL | 1 refills | Status: AC
  Filled 2025-01-23: qty 13, 91d supply, fill #0

## 2025-01-23 NOTE — Patient Instructions (Addendum)
 Please stop by lab before you go If you have mychart- we will send your results within 3 business days of us  receiving them.  If you do not have mychart- we will call you about results within 5 business days of us  receiving them.  *please also note that you will see labs on mychart as soon as they post. I will later go in and write notes on them- will say notes from Dr. Katrinka   Recommended follow up: Return in about 1 year (around 01/23/2026) for physical or sooner if needed.Schedule b4 you leave.

## 2025-01-24 ENCOUNTER — Other Ambulatory Visit (HOSPITAL_COMMUNITY): Payer: Self-pay

## 2025-05-11 ENCOUNTER — Encounter: Admitting: Family Medicine

## 2026-01-26 ENCOUNTER — Encounter: Admitting: Family Medicine
